# Patient Record
Sex: Male | Born: 1935 | Race: White | Hispanic: No | Smoking: Current every day smoker
Health system: Southern US, Community
[De-identification: ages and names within clinical notes are randomized; demographics above are authoritative.]

## PROBLEM LIST (undated history)

## (undated) DIAGNOSIS — R7302 Impaired glucose tolerance (oral): Secondary | ICD-10-CM

## (undated) DIAGNOSIS — Z720 Tobacco use: Secondary | ICD-10-CM

## (undated) DIAGNOSIS — Z86010 Personal history of colonic polyps: Secondary | ICD-10-CM

## (undated) DIAGNOSIS — C4490 Unspecified malignant neoplasm of skin, unspecified: Secondary | ICD-10-CM

## (undated) DIAGNOSIS — J449 Chronic obstructive pulmonary disease, unspecified: Principal | ICD-10-CM

## (undated) HISTORY — DX: Personal history of colonic polyps: Z86.010

## (undated) HISTORY — DX: Impaired glucose tolerance (oral): R73.02

## (undated) HISTORY — DX: Tobacco use: Z72.0

## (undated) HISTORY — DX: Chronic obstructive pulmonary disease, unspecified: J44.9

## (undated) HISTORY — DX: Unspecified malignant neoplasm of skin, unspecified: C44.90

---

## 2001-10-19 ENCOUNTER — Encounter: Payer: Self-pay | Admitting: Internal Medicine

## 2001-10-19 ENCOUNTER — Encounter: Admission: RE | Admit: 2001-10-19 | Discharge: 2001-10-19 | Payer: Self-pay | Admitting: Internal Medicine

## 2002-10-22 ENCOUNTER — Encounter: Payer: Self-pay | Admitting: Internal Medicine

## 2002-10-22 ENCOUNTER — Encounter: Admission: RE | Admit: 2002-10-22 | Discharge: 2002-10-22 | Payer: Self-pay | Admitting: Internal Medicine

## 2003-10-29 ENCOUNTER — Encounter: Admission: RE | Admit: 2003-10-29 | Discharge: 2003-10-29 | Payer: Self-pay | Admitting: Internal Medicine

## 2003-11-03 ENCOUNTER — Encounter: Admission: RE | Admit: 2003-11-03 | Discharge: 2003-11-03 | Payer: Self-pay | Admitting: Internal Medicine

## 2006-02-24 ENCOUNTER — Encounter: Admission: RE | Admit: 2006-02-24 | Discharge: 2006-02-24 | Payer: Self-pay | Admitting: Internal Medicine

## 2007-03-02 ENCOUNTER — Ambulatory Visit: Payer: Self-pay | Admitting: Internal Medicine

## 2007-03-02 LAB — CONVERTED CEMR LAB
ALT: 26 units/L (ref 0–40)
AST: 27 units/L (ref 0–37)
Albumin: 3.3 g/dL — ABNORMAL LOW (ref 3.5–5.2)
Alkaline Phosphatase: 96 units/L (ref 39–117)
BUN: 11 mg/dL (ref 6–23)
Basophils Absolute: 0.1 10*3/uL (ref 0.0–0.1)
Basophils Relative: 1.2 % — ABNORMAL HIGH (ref 0.0–1.0)
Bilirubin Urine: NEGATIVE
Bilirubin, Direct: 0.1 mg/dL (ref 0.0–0.3)
CO2: 29 meq/L (ref 19–32)
Calcium: 8.2 mg/dL — ABNORMAL LOW (ref 8.4–10.5)
Chloride: 102 meq/L (ref 96–112)
Cholesterol: 113 mg/dL (ref 0–200)
Creatinine, Ser: 0.8 mg/dL (ref 0.4–1.5)
Eosinophils Absolute: 0.1 10*3/uL (ref 0.0–0.6)
Eosinophils Relative: 1.6 % (ref 0.0–5.0)
GFR calc Af Amer: 124 mL/min
GFR calc non Af Amer: 102 mL/min
Glucose, Bld: 88 mg/dL (ref 70–99)
HCT: 42.4 % (ref 39.0–52.0)
HDL: 40.8 mg/dL (ref >39.0)
Hemoglobin, Urine: NEGATIVE
Hemoglobin: 14.3 g/dL (ref 13.0–17.0)
Ketones, ur: NEGATIVE mg/dL
LDL Cholesterol: 54 mg/dL (ref 0–99)
Leukocytes, UA: NEGATIVE
Lymphocytes Relative: 18.1 % (ref 12.0–46.0)
MCHC: 33.8 g/dL (ref 30.0–36.0)
MCV: 102.8 fL — ABNORMAL HIGH (ref 78.0–100.0)
Monocytes Absolute: 1.1 10*3/uL — ABNORMAL HIGH (ref 0.2–0.7)
Monocytes Relative: 13.1 % — ABNORMAL HIGH (ref 3.0–11.0)
Neutro Abs: 5.6 10*3/uL (ref 1.4–7.7)
Neutrophils Relative %: 66 % (ref 43.0–77.0)
Nitrite: NEGATIVE
PSA: 0.32 ng/mL (ref 0.10–4.00)
Platelets: 314 10*3/uL (ref 150–400)
Potassium: 4.8 meq/L (ref 3.5–5.1)
RBC: 4.13 M/uL — ABNORMAL LOW (ref 4.22–5.81)
RDW: 14.4 % (ref 11.5–14.6)
Sodium: 136 meq/L (ref 135–145)
Specific Gravity, Urine: <=1.005 (ref 1.000–1.03)
TSH: 1.49 microintl units/mL (ref 0.35–5.50)
Total Bilirubin: 0.6 mg/dL (ref 0.3–1.2)
Total CHOL/HDL Ratio: 2.8
Total Protein, Urine: NEGATIVE mg/dL
Total Protein: 5.6 g/dL — ABNORMAL LOW (ref 6.0–8.3)
Triglycerides: 90 mg/dL (ref 0–149)
Urine Glucose: NEGATIVE mg/dL
Urobilinogen, UA: 0.2 (ref 0.0–1.0)
VLDL: 18 mg/dL (ref 0–40)
WBC: 8.4 10*3/uL (ref 4.5–10.5)
pH: 6 (ref 5.0–8.0)

## 2008-07-21 ENCOUNTER — Encounter: Payer: Self-pay | Admitting: Internal Medicine

## 2008-07-21 LAB — HM COLONOSCOPY: HM Colonoscopy: NORMAL

## 2008-07-23 ENCOUNTER — Encounter: Payer: Self-pay | Admitting: Internal Medicine

## 2008-07-23 DIAGNOSIS — Z860100 Personal history of colon polyps, unspecified: Secondary | ICD-10-CM | POA: Insufficient documentation

## 2008-07-23 HISTORY — DX: Personal history of colonic polyps: Z86.010

## 2011-10-09 ENCOUNTER — Encounter: Payer: Self-pay | Admitting: Internal Medicine

## 2011-10-09 DIAGNOSIS — Z0000 Encounter for general adult medical examination without abnormal findings: Secondary | ICD-10-CM | POA: Insufficient documentation

## 2011-10-12 ENCOUNTER — Other Ambulatory Visit (INDEPENDENT_AMBULATORY_CARE_PROVIDER_SITE_OTHER): Payer: Medicare Other

## 2011-10-12 ENCOUNTER — Ambulatory Visit (INDEPENDENT_AMBULATORY_CARE_PROVIDER_SITE_OTHER)
Admission: RE | Admit: 2011-10-12 | Discharge: 2011-10-12 | Disposition: A | Discharge status: Home / Self Care | Payer: Medicare Other | Source: Ambulatory Visit | Attending: Internal Medicine | Admitting: Internal Medicine

## 2011-10-12 ENCOUNTER — Ambulatory Visit (INDEPENDENT_AMBULATORY_CARE_PROVIDER_SITE_OTHER): Payer: Medicare Other | Admitting: Internal Medicine

## 2011-10-12 ENCOUNTER — Encounter: Payer: Self-pay | Admitting: Internal Medicine

## 2011-10-12 VITALS — BP 140/78 | HR 78 | Temp 97.7°F | Ht 70.5 in | Wt 123.4 lb

## 2011-10-12 DIAGNOSIS — R7302 Impaired glucose tolerance (oral): Secondary | ICD-10-CM

## 2011-10-12 DIAGNOSIS — Z136 Encounter for screening for cardiovascular disorders: Secondary | ICD-10-CM

## 2011-10-12 DIAGNOSIS — J4489 Other specified chronic obstructive pulmonary disease: Secondary | ICD-10-CM

## 2011-10-12 DIAGNOSIS — Z23 Encounter for immunization: Secondary | ICD-10-CM

## 2011-10-12 DIAGNOSIS — J449 Chronic obstructive pulmonary disease, unspecified: Secondary | ICD-10-CM

## 2011-10-12 DIAGNOSIS — R7309 Other abnormal glucose: Secondary | ICD-10-CM

## 2011-10-12 DIAGNOSIS — R5383 Other fatigue: Secondary | ICD-10-CM

## 2011-10-12 DIAGNOSIS — N320 Bladder-neck obstruction: Secondary | ICD-10-CM

## 2011-10-12 DIAGNOSIS — Z8611 Personal history of tuberculosis: Secondary | ICD-10-CM

## 2011-10-12 DIAGNOSIS — R5381 Other malaise: Secondary | ICD-10-CM

## 2011-10-12 DIAGNOSIS — C4490 Unspecified malignant neoplasm of skin, unspecified: Secondary | ICD-10-CM

## 2011-10-12 DIAGNOSIS — F17210 Nicotine dependence, cigarettes, uncomplicated: Secondary | ICD-10-CM | POA: Insufficient documentation

## 2011-10-12 DIAGNOSIS — Z720 Tobacco use: Secondary | ICD-10-CM

## 2011-10-12 HISTORY — DX: Impaired glucose tolerance (oral): R73.02

## 2011-10-12 HISTORY — DX: Chronic obstructive pulmonary disease, unspecified: J44.9

## 2011-10-12 HISTORY — DX: Tobacco use: Z72.0

## 2011-10-12 HISTORY — DX: Unspecified malignant neoplasm of skin, unspecified: C44.90

## 2011-10-12 LAB — CBC WITH DIFFERENTIAL/PLATELET
Basophils Absolute: 0 10*3/uL (ref 0.0–0.1)
Eosinophils Relative: 1.9 % (ref 0.0–5.0)
HCT: 44.5 % (ref 39.0–52.0)
Hemoglobin: 14.8 g/dL (ref 13.0–17.0)
Lymphs Abs: 1.4 10*3/uL (ref 0.7–4.0)
MCV: 107.2 fl — ABNORMAL HIGH (ref 78.0–100.0)
Monocytes Absolute: 1 10*3/uL (ref 0.1–1.0)
Neutro Abs: 4.3 10*3/uL (ref 1.4–7.7)
Platelets: 278 10*3/uL (ref 150.0–400.0)
RDW: 15.7 % — ABNORMAL HIGH (ref 11.5–14.6)

## 2011-10-12 LAB — BASIC METABOLIC PANEL
GFR: 96.56 mL/min (ref >60.00)
Potassium: 4.9 mEq/L (ref 3.5–5.1)
Sodium: 135 mEq/L (ref 135–145)

## 2011-10-12 LAB — LIPID PANEL
LDL Cholesterol: 57 mg/dL (ref 0–99)
Total CHOL/HDL Ratio: 2
VLDL: 14.6 mg/dL (ref 0.0–40.0)

## 2011-10-12 LAB — URINALYSIS, ROUTINE W REFLEX MICROSCOPIC
Bilirubin Urine: NEGATIVE
Ketones, ur: NEGATIVE
Leukocytes, UA: NEGATIVE
Urine Glucose: NEGATIVE
pH: 6 (ref 5.0–8.0)

## 2011-10-12 LAB — HEPATIC FUNCTION PANEL
AST: 25 U/L (ref 0–37)
Alkaline Phosphatase: 71 U/L (ref 39–117)
Total Bilirubin: 0.6 mg/dL (ref 0.3–1.2)

## 2011-10-12 LAB — TSH: TSH: 2.12 u[IU]/mL (ref 0.35–5.50)

## 2011-10-12 LAB — PSA: PSA: 0.76 ng/mL (ref 0.10–4.00)

## 2011-10-12 NOTE — Assessment & Plan Note (Signed)
S/p left pinna and left cheek approx 2007;  For referral for new lesion right face under lower eyelid

## 2011-10-12 NOTE — Assessment & Plan Note (Deleted)

## 2011-10-12 NOTE — Assessment & Plan Note (Signed)
stable overall by hx and exam, most recent data reviewed with pt, and pt to continue medical treatment as before  SpO2 Readings from Last 3 Encounters:  10/12/11 97%

## 2011-10-12 NOTE — Patient Instructions (Addendum)
You had the flu, pneumonia, and tetanus shots today The nurse will check on the shingles shot Please stop smoking Please go to LAB in the Basement for the blood and/or urine tests to be done today Please go to XRAY in the Basement for the x-ray test Please call the phone number [phone] (the [employer] System) for results of testing in 2-3 days;  When calling, simply dial the number, and when prompted enter the MRN number above (the Medical Record Number) and the # key, then the message should start You will be contacted regarding the referral for: dermatology Please return in 1 year for your yearly visit, or sooner if needed

## 2011-10-12 NOTE — Assessment & Plan Note (Signed)
Asymtp, but will need a1c, cont diet,  to f/u any worsening symptoms or concerns

## 2011-10-12 NOTE — Assessment & Plan Note (Signed)
Etiology unclear, Exam otherwise benign, to check labs as documented, follow with expectant management, ECG reviewed as per emr

## 2011-10-13 ENCOUNTER — Encounter: Payer: Self-pay | Admitting: Internal Medicine

## 2011-10-13 ENCOUNTER — Telehealth: Payer: Self-pay | Admitting: *Deleted

## 2011-10-13 MED ORDER — ASPIRIN EC 81 MG PO TBEC: 81.0000 mg | DELAYED_RELEASE_TABLET | Freq: Every day | ORAL | Status: AC

## 2011-10-13 NOTE — Telephone Encounter (Signed)
Called pt spoke with wife was not at home went to dermatologist appt left msg with her to give [place] a call back...10/13/11@1 :30pm/LMB

## 2011-10-13 NOTE — Assessment & Plan Note (Signed)
Urged to quit 

## 2011-10-13 NOTE — Telephone Encounter (Signed)
Pt spouse requested lab result mailed to pt' home. Labs sent.

## 2011-10-13 NOTE — Assessment & Plan Note (Signed)
Asymtp, to check f/u cxr per pt request, has had yearly cxr in the past

## 2011-10-13 NOTE — Assessment & Plan Note (Signed)
asympt - also for PSA, UA today

## 2011-10-13 NOTE — Telephone Encounter (Signed)
Message copied by Deatra James on Thu Oct 13, 2011  1:29 PM ------      Message from: Anselm Jungling      Created: Thu Oct 13, 2011  7:59 AM      Regarding: FW: call pt                   ----- Message -----         From: Oliver Barre, MD         Sent: 10/13/2011   7:11 AM           To: Margaret Pyle, CMA      Subject: call pt                                                  Not mentioned yest at his exam, but pt needs to start ASA 81 mg - 1 per day, to reduce risk of stroke and MI

## 2011-10-13 NOTE — Progress Notes (Signed)
  Subjective:    Patient ID: Vincent [name], male    DOB: Jun 10, 1936, 75 y.o.   MRN: [id number]  HPI  Here to establish, wife is pt here as well. Pt denies chest pain, increased sob or doe, wheezing, orthopnea, PND, increased LE swelling, palpitations, dizziness or syncope.  Pt denies new neurological symptoms such as new headache, or facial or extremity weakness or numbness, though Does have sense of ongoing fatigue, but denies signficant hypersomnolence.   Pt denies polydipsia, polyuria,   Pt states overall good compliance with meds, trying to follow lower cholesterol  diet, wt overall stable but little exercise however.    Hard to quit smoking but is considering.  Requests cxr with hx of TB remotely.   Pt denies fever, wt loss, night sweats, loss of appetite, or other constitutional symptoms.  Does have recent worsening skin lesion to right cheek below the right eye in last 2 mo, with hx of skin ca a few yrs ago removed from left pinna per derm. Past Medical History  Diagnosis Date  . COLONIC POLYPS, HX OF 07/23/2008  . COPD (chronic obstructive pulmonary disease) 10/12/2011  . Tobacco abuse 10/12/2011  . Impaired glucose tolerance 10/12/2011  . Skin cancer 10/12/2011   History reviewed. No pertinent past surgical history.  reports that he has been smoking.  He does not have any smokeless tobacco history on file. He reports that he drinks alcohol. He reports that he does not use illicit drugs. family history includes Cancer in his mother and Heart disease in his father. No Known Allergies No current outpatient prescriptions on file prior to visit.   Review of Systems Review of Systems  Constitutional: Negative for diaphoresis and unexpected weight change.  HENT: Negative for drooling and tinnitus.   Eyes: Negative for photophobia and visual disturbance.  Respiratory: Negative for choking and stridor.   Gastrointestinal: Negative for vomiting and blood in stool.  Genitourinary: Negative  for hematuria and decreased urine volume.  Musculoskeletal: Negative for gait problem.  Skin: Negative for color change and wound.  Neurological: Negative for tremors and numbness.  Psychiatric/Behavioral: Negative for decreased concentration. The patient is not hyperactive.       Objective:   Physical Exam BP 140/78  Pulse 78  Temp(Src) 97.7 F (36.5 C) (Oral)  Ht 5' 10.5" (1.791 m)  Wt 123 lb 6.4 oz (55.974 kg)  BMI 17.46 kg/m2  SpO2 97% Physical Exam  VS noted Constitutional: Pt appears well-developed and well-nourished.  HENT: Head: Normocephalic.  Right Ear: External ear normal.  Left Ear: External ear normal.  Eyes: Conjunctivae and EOM are normal. Pupils are equal, round, and reactive to light.  Neck: Normal range of motion. Neck supple.  Cardiovascular: Normal rate and regular rhythm.   Pulmonary/Chest: Effort normal and breath sounds mild decreased, no rales or wheezing  Abd:  Soft, NT, non-distended, + BS Neurological: Pt is alert. No cranial nerve deficit.  Skin: Skin is warm. No erythema. right face with 10 mm horn like lesion below the right eyelid Psychiatric: Pt behavior is normal. Thought content normal.     Assessment & Plan:

## 2011-10-20 ENCOUNTER — Telehealth: Payer: Self-pay

## 2011-10-20 NOTE — Telephone Encounter (Signed)
Pt called requesting Rx for Zostavax to pharmacy

## 2011-10-20 NOTE — Telephone Encounter (Signed)
Done hardcopy to robin  

## 2011-10-20 NOTE — Telephone Encounter (Signed)
Called the patient informed rx was completed. Informed the patient of the cost of the shingles vaccine per MD's instructions. The patient stated was ok and to fax to [facility] as [facility] [address]. Informed they do not administer the shingles vaccine.

## 2011-10-21 ENCOUNTER — Telehealth: Payer: Self-pay

## 2011-10-21 NOTE — Telephone Encounter (Signed)
Called the patient to inform faxed prescription to [facility] for his shingles vaccine last yesterday 10/20/2011. But shortly after received a call from his wife Malachi Bonds [place]) over cost of the vaccine as I had informed the patient of the cost per MD's instructions. Informed the patient that [facility] had been instructed not to fill prescription until he had made a decision he wanted to receive it.

## 2011-10-21 NOTE — Telephone Encounter (Signed)
The patients wife called to inform that the patient did not get the shingles vaccine that was sent to [facility] on 10/20/2011, the cost was too much and has decided not to get it at this time.

## 2012-11-29 ENCOUNTER — Ambulatory Visit (INDEPENDENT_AMBULATORY_CARE_PROVIDER_SITE_OTHER): Payer: 59 | Admitting: Radiology

## 2012-11-29 DIAGNOSIS — Z23 Encounter for immunization: Secondary | ICD-10-CM

## 2013-08-08 ENCOUNTER — Other Ambulatory Visit: Payer: Self-pay | Admitting: Gastroenterology

## 2013-08-08 DIAGNOSIS — Z139 Encounter for screening, unspecified: Secondary | ICD-10-CM

## 2013-09-16 ENCOUNTER — Ambulatory Visit
Admission: RE | Admit: 2013-09-16 | Discharge: 2013-09-16 | Disposition: A | Discharge status: Home / Self Care | Payer: Medicare Other | Source: Ambulatory Visit | Attending: Gastroenterology | Admitting: Gastroenterology

## 2013-09-16 DIAGNOSIS — Z139 Encounter for screening, unspecified: Secondary | ICD-10-CM

## 2013-09-17 ENCOUNTER — Telehealth: Payer: Self-pay | Admitting: Internal Medicine

## 2013-09-17 NOTE — Telephone Encounter (Signed)
Recd records from [facility], Forwarding 3 pages to Dr.John

## 2013-09-23 ENCOUNTER — Ambulatory Visit (INDEPENDENT_AMBULATORY_CARE_PROVIDER_SITE_OTHER): Payer: 59 | Admitting: Radiology

## 2013-09-23 DIAGNOSIS — Z23 Encounter for immunization: Secondary | ICD-10-CM

## 2014-04-30 ENCOUNTER — Other Ambulatory Visit (INDEPENDENT_AMBULATORY_CARE_PROVIDER_SITE_OTHER): Payer: Medicare Other

## 2014-04-30 ENCOUNTER — Ambulatory Visit (INDEPENDENT_AMBULATORY_CARE_PROVIDER_SITE_OTHER): Payer: Medicare Other | Admitting: Internal Medicine

## 2014-04-30 ENCOUNTER — Encounter: Payer: Self-pay | Admitting: Internal Medicine

## 2014-04-30 ENCOUNTER — Ambulatory Visit (INDEPENDENT_AMBULATORY_CARE_PROVIDER_SITE_OTHER)
Admission: RE | Admit: 2014-04-30 | Discharge: 2014-04-30 | Disposition: A | Discharge status: Home / Self Care | Payer: Medicare Other | Source: Ambulatory Visit | Attending: Internal Medicine | Admitting: Internal Medicine

## 2014-04-30 ENCOUNTER — Telehealth: Payer: Self-pay | Admitting: Internal Medicine

## 2014-04-30 VITALS — BP 130/80 | HR 83 | Temp 98.3°F | Ht 69.0 in | Wt 123.0 lb

## 2014-04-30 DIAGNOSIS — Z0000 Encounter for general adult medical examination without abnormal findings: Secondary | ICD-10-CM

## 2014-04-30 DIAGNOSIS — Z136 Encounter for screening for cardiovascular disorders: Secondary | ICD-10-CM

## 2014-04-30 DIAGNOSIS — Z8611 Personal history of tuberculosis: Secondary | ICD-10-CM

## 2014-04-30 DIAGNOSIS — J449 Chronic obstructive pulmonary disease, unspecified: Secondary | ICD-10-CM

## 2014-04-30 DIAGNOSIS — Z23 Encounter for immunization: Secondary | ICD-10-CM

## 2014-04-30 DIAGNOSIS — Z125 Encounter for screening for malignant neoplasm of prostate: Secondary | ICD-10-CM

## 2014-04-30 LAB — URINALYSIS, ROUTINE W REFLEX MICROSCOPIC
Bilirubin Urine: NEGATIVE
Hgb urine dipstick: NEGATIVE
KETONES UR: NEGATIVE
LEUKOCYTES UA: NEGATIVE
NITRITE: NEGATIVE
Specific Gravity, Urine: 1.015 (ref 1.000–1.030)
Total Protein, Urine: NEGATIVE
Urine Glucose: NEGATIVE
Urobilinogen, UA: 0.2 (ref 0.0–1.0)
pH: 6 (ref 5.0–8.0)

## 2014-04-30 LAB — BASIC METABOLIC PANEL
BUN: 13 mg/dL (ref 6–23)
CHLORIDE: 102 meq/L (ref 96–112)
CO2: 29 meq/L (ref 19–32)
Calcium: 8.6 mg/dL (ref 8.4–10.5)
Creatinine, Ser: 0.9 mg/dL (ref 0.4–1.5)
GFR: 88.47 mL/min (ref >60.00)
Glucose, Bld: 90 mg/dL (ref 70–99)
POTASSIUM: 5.5 meq/L — AB (ref 3.5–5.1)
SODIUM: 135 meq/L (ref 135–145)

## 2014-04-30 LAB — HEPATIC FUNCTION PANEL
ALT: 24 U/L (ref 0–53)
AST: 22 U/L (ref 0–37)
Albumin: 3.9 g/dL (ref 3.5–5.2)
Alkaline Phosphatase: 93 U/L (ref 39–117)
BILIRUBIN DIRECT: 0.1 mg/dL (ref 0.0–0.3)
TOTAL PROTEIN: 6 g/dL (ref 6.0–8.3)
Total Bilirubin: 0.3 mg/dL (ref 0.2–1.2)

## 2014-04-30 LAB — LIPID PANEL
Cholesterol: 116 mg/dL (ref 0–200)
HDL: 47.8 mg/dL (ref >39.00)
LDL Cholesterol: 58 mg/dL (ref 0–99)
TRIGLYCERIDES: 53 mg/dL (ref 0.0–149.0)
Total CHOL/HDL Ratio: 2
VLDL: 10.6 mg/dL (ref 0.0–40.0)

## 2014-04-30 LAB — CBC WITH DIFFERENTIAL/PLATELET
Basophils Absolute: 0 10*3/uL (ref 0.0–0.1)
Basophils Relative: 0.5 % (ref 0.0–3.0)
Eosinophils Absolute: 0.1 10*3/uL (ref 0.0–0.7)
Eosinophils Relative: 1.5 % (ref 0.0–5.0)
HCT: 40.5 % (ref 39.0–52.0)
HEMOGLOBIN: 13.6 g/dL (ref 13.0–17.0)
Lymphocytes Relative: 19.2 % (ref 12.0–46.0)
Lymphs Abs: 1.4 10*3/uL (ref 0.7–4.0)
MCHC: 33.5 g/dL (ref 30.0–36.0)
MCV: 107.7 fl — ABNORMAL HIGH (ref 78.0–100.0)
MONOS PCT: 14.2 % — AB (ref 3.0–12.0)
Monocytes Absolute: 1 10*3/uL (ref 0.1–1.0)
Neutro Abs: 4.5 10*3/uL (ref 1.4–7.7)
Neutrophils Relative %: 64.6 % (ref 43.0–77.0)
PLATELETS: 307 10*3/uL (ref 150.0–400.0)
RBC: 3.76 Mil/uL — ABNORMAL LOW (ref 4.22–5.81)
RDW: 15.9 % — ABNORMAL HIGH (ref 11.5–15.5)
WBC: 7 10*3/uL (ref 4.0–10.5)

## 2014-04-30 LAB — TSH: TSH: 1.41 u[IU]/mL (ref 0.35–4.50)

## 2014-04-30 LAB — PSA: PSA: 0.72 ng/mL (ref 0.10–4.00)

## 2014-04-30 MED ORDER — ASPIRIN EC 81 MG PO TBEC: 81.0000 mg | DELAYED_RELEASE_TABLET | Freq: Every day | ORAL | Status: DC

## 2014-04-30 MED ORDER — TIOTROPIUM BROMIDE MONOHYDRATE 18 MCG IN CAPS: 18.0000 ug | ORAL_CAPSULE | Freq: Every day | RESPIRATORY_TRACT | Status: DC

## 2014-04-30 NOTE — Telephone Encounter (Signed)
Relevant patient education mailed to patient.  

## 2014-04-30 NOTE — Patient Instructions (Addendum)
You had the new Prevnar pneumonia shot today  Please start Aspirin 81 mg (enteric coated only) - 1 per day - to help reduce risk of heart attack and stroke  Please take all new medication as prescribed - the Spiriva  Please go to the XRAY Department in the Basement (go straight as you get off the elevator) for the x-ray testing  Please go to the LAB in the Basement (turn left off the elevator) for the tests to be done today  You will be contacted by phone if any changes need to be made immediately.  Otherwise, you will receive a letter about your results with an explanation, but please check with MyChart first.  Please remember to sign up for MyChart if you have not done so, as this will be important to you in the future with finding out test results, communicating by private email, and scheduling acute appointments online when needed.  Please keep your appointments with your specialists as you may have planned  Please return in 1 year for your yearly visit, or sooner if needed

## 2014-04-30 NOTE — Progress Notes (Signed)
Subjective:    Patient ID: Vincent [name], male    DOB: 09/30/1936, 78 y.o.   MRN: [id number]  HPI  Here for wellness and f/u;  Overall doing ok;  Pt denies CP, worsening SOB, DOE, wheezing, orthopnea, PND, worsening LE edema, palpitations, dizziness or syncope, except for increased mild sob/doe over last year.  Pt denies neurological change such as new headache, facial or extremity weakness.  Pt denies polydipsia, polyuria, or low sugar symptoms. Pt states overall good compliance with treatment and medications, good tolerability, and has been trying to follow lower cholesterol diet.  Pt denies worsening depressive symptoms, suicidal ideation or panic. No fever, night sweats, wt loss, loss of appetite, or other constitutional symptoms.  Pt states good ability with ADL's, has low fall risk, home safety reviewed and adequate, no other significant changes in hearing or vision, and only occasionally active with exercise.  Hx of TB - requests f/u cxr Past Medical History  Diagnosis Date  . COLONIC POLYPS, HX OF 07/23/2008  . COPD (chronic obstructive pulmonary disease) 10/12/2011  . Tobacco abuse 10/12/2011  . Impaired glucose tolerance 10/12/2011  . Skin cancer 10/12/2011   No past surgical history on file.  reports that he has been smoking.  He does not have any smokeless tobacco history on file. He reports that he drinks alcohol. He reports that he does not use illicit drugs. family history includes Cancer in his mother; Heart disease in his father. No Known Allergies No current outpatient prescriptions on file prior to visit.   No current facility-administered medications on file prior to visit.   Review of Systems Constitutional: Negative for increased diaphoresis, other activity, appetite or other siginficant weight change  HENT: Negative for worsening hearing loss, ear pain, facial swelling, mouth sores and neck stiffness.   Eyes: Negative for other worsening pain, redness or visual  disturbance.  Respiratory: Negative for shortness of breath and wheezing.   Cardiovascular: Negative for chest pain and palpitations.  Gastrointestinal: Negative for diarrhea, blood in stool, abdominal distention or other pain Genitourinary: Negative for hematuria, flank pain or change in urine volume.  Musculoskeletal: Negative for myalgias or other joint complaints.  Skin: Negative for color change and wound.  Neurological: Negative for syncope and numbness. other than noted Hematological: Negative for adenopathy. or other swelling Psychiatric/Behavioral: Negative for hallucinations, self-injury, decreased concentration or other worsening agitation.      Objective:   Physical Exam BP 130/80  Pulse 83  Temp(Src) 98.3 F (36.8 C) (Oral)  Ht 5\' 9"  (1.753 m)  Wt 123 lb (55.792 kg)  BMI 18.16 kg/m2  SpO2 95% VS noted,  Constitutional: Pt is oriented to person, place, and time. Appears well-developed and well-nourished.  Head: Normocephalic and atraumatic.  Right Ear: External ear normal.  Left Ear: External ear normal.  Nose: Nose normal.  Mouth/Throat: Oropharynx is clear and moist.  Eyes: Conjunctivae and EOM are normal. Pupils are equal, [name], and reactive to light.  Neck: Normal range of motion. Neck supple. No JVD present. No tracheal deviation present.  Cardiovascular: Normal rate, regular rhythm, normal heart sounds and intact distal pulses.   Pulmonary/Chest: Effort normal and breath sounds without rales or wheezing  Abdominal: Soft. Bowel sounds are normal. NT. No HSM  Musculoskeletal: Normal range of motion. Exhibits no edema.  Lymphadenopathy:  Has no cervical adenopathy.  Neurological: Pt is alert and oriented to person, place, and time. Pt has normal reflexes. No cranial nerve deficit. Motor grossly intact Skin: Skin  is warm and dry. No rash noted.  Psychiatric:  Has normal mood and affect. Behavior is normal.     Assessment & Plan:

## 2014-04-30 NOTE — Assessment & Plan Note (Signed)

## 2014-04-30 NOTE — Progress Notes (Signed)
Pre visit review using our clinic review tool, if applicable. No additional management support is needed unless otherwise documented below in the visit note. 

## 2014-05-03 NOTE — Assessment & Plan Note (Signed)
For cxr

## 2014-05-03 NOTE — Assessment & Plan Note (Signed)
For spiriva asd, o/w stable overall by history and exam, recent data reviewed with pt, and pt to continue medical treatment as before,  to f/u any worsening symptoms or concerns SpO2 Readings from Last 3 Encounters:  04/30/14 95%  10/12/11 97%

## 2014-11-26 ENCOUNTER — Ambulatory Visit: Payer: 59 | Admitting: Radiology

## 2014-11-26 ENCOUNTER — Ambulatory Visit (INDEPENDENT_AMBULATORY_CARE_PROVIDER_SITE_OTHER): Payer: 59 | Admitting: Radiology

## 2014-11-26 DIAGNOSIS — Z23 Encounter for immunization: Secondary | ICD-10-CM

## 2015-05-07 ENCOUNTER — Other Ambulatory Visit (INDEPENDENT_AMBULATORY_CARE_PROVIDER_SITE_OTHER): Payer: Medicare Other

## 2015-05-07 ENCOUNTER — Ambulatory Visit (INDEPENDENT_AMBULATORY_CARE_PROVIDER_SITE_OTHER): Payer: Medicare Other | Admitting: Internal Medicine

## 2015-05-07 ENCOUNTER — Encounter: Payer: Self-pay | Admitting: Internal Medicine

## 2015-05-07 VITALS — BP 150/64 | HR 82 | Temp 98.6°F | Wt 121.2 lb

## 2015-05-07 DIAGNOSIS — Z23 Encounter for immunization: Secondary | ICD-10-CM | POA: Diagnosis not present

## 2015-05-07 DIAGNOSIS — Z720 Tobacco use: Secondary | ICD-10-CM

## 2015-05-07 DIAGNOSIS — Z0000 Encounter for general adult medical examination without abnormal findings: Secondary | ICD-10-CM

## 2015-05-07 DIAGNOSIS — J449 Chronic obstructive pulmonary disease, unspecified: Secondary | ICD-10-CM

## 2015-05-07 DIAGNOSIS — N320 Bladder-neck obstruction: Secondary | ICD-10-CM

## 2015-05-07 DIAGNOSIS — R7302 Impaired glucose tolerance (oral): Secondary | ICD-10-CM

## 2015-05-07 LAB — LIPID PANEL
CHOLESTEROL: 110 mg/dL (ref 0–200)
HDL: 41.5 mg/dL (ref >39.00)
LDL Cholesterol: 56 mg/dL (ref 0–99)
NonHDL: 68.5
TRIGLYCERIDES: 61 mg/dL (ref 0.0–149.0)
Total CHOL/HDL Ratio: 3
VLDL: 12.2 mg/dL (ref 0.0–40.0)

## 2015-05-07 LAB — CBC WITH DIFFERENTIAL/PLATELET
BASOS PCT: 0.8 % (ref 0.0–3.0)
Basophils Absolute: 0 10*3/uL (ref 0.0–0.1)
EOS ABS: 0.1 10*3/uL (ref 0.0–0.7)
EOS PCT: 1.1 % (ref 0.0–5.0)
HEMATOCRIT: 41.6 % (ref 39.0–52.0)
HEMOGLOBIN: 14.1 g/dL (ref 13.0–17.0)
LYMPHS ABS: 1.3 10*3/uL (ref 0.7–4.0)
Lymphocytes Relative: 22.8 % (ref 12.0–46.0)
MCHC: 34 g/dL (ref 30.0–36.0)
MCV: 105.9 fl — AB (ref 78.0–100.0)
MONO ABS: 0.8 10*3/uL (ref 0.1–1.0)
Monocytes Relative: 13.2 % — ABNORMAL HIGH (ref 3.0–12.0)
NEUTROS ABS: 3.7 10*3/uL (ref 1.4–7.7)
Neutrophils Relative %: 62.1 % (ref 43.0–77.0)
Platelets: 300 10*3/uL (ref 150.0–400.0)
RBC: 3.93 Mil/uL — ABNORMAL LOW (ref 4.22–5.81)
RDW: 16.8 % — ABNORMAL HIGH (ref 11.5–15.5)
WBC: 5.9 10*3/uL (ref 4.0–10.5)

## 2015-05-07 LAB — URINALYSIS, ROUTINE W REFLEX MICROSCOPIC
Bilirubin Urine: NEGATIVE
HGB URINE DIPSTICK: NEGATIVE
Ketones, ur: NEGATIVE
LEUKOCYTES UA: NEGATIVE
Nitrite: NEGATIVE
Specific Gravity, Urine: 1.02 (ref 1.000–1.030)
Total Protein, Urine: NEGATIVE
URINE GLUCOSE: NEGATIVE
UROBILINOGEN UA: 0.2 (ref 0.0–1.0)
WBC UA: NONE SEEN (ref 0–?)
pH: 6 (ref 5.0–8.0)

## 2015-05-07 LAB — BASIC METABOLIC PANEL
BUN: 17 mg/dL (ref 6–23)
CO2: 28 mEq/L (ref 19–32)
Calcium: 8.3 mg/dL — ABNORMAL LOW (ref 8.4–10.5)
Chloride: 106 mEq/L (ref 96–112)
Creatinine, Ser: 0.91 mg/dL (ref 0.40–1.50)
GFR: 85.99 mL/min (ref >60.00)
Glucose, Bld: 80 mg/dL (ref 70–99)
POTASSIUM: 5 meq/L (ref 3.5–5.1)
Sodium: 136 mEq/L (ref 135–145)

## 2015-05-07 LAB — HEPATIC FUNCTION PANEL
ALBUMIN: 3.7 g/dL (ref 3.5–5.2)
ALT: 18 U/L (ref 0–53)
AST: 20 U/L (ref 0–37)
Alkaline Phosphatase: 110 U/L (ref 39–117)
BILIRUBIN TOTAL: 0.4 mg/dL (ref 0.2–1.2)
Bilirubin, Direct: 0.1 mg/dL (ref 0.0–0.3)
Total Protein: 5.8 g/dL — ABNORMAL LOW (ref 6.0–8.3)

## 2015-05-07 LAB — HEMOGLOBIN A1C: HEMOGLOBIN A1C: 5.2 % (ref 4.6–6.5)

## 2015-05-07 LAB — PSA: PSA: 0.76 ng/mL (ref 0.10–4.00)

## 2015-05-07 LAB — TSH: TSH: 1.08 u[IU]/mL (ref 0.35–4.50)

## 2015-05-07 NOTE — Patient Instructions (Addendum)
You had the Shingles shot today  Please continue all other medications as before, and refills have been done if requested.  Please have the pharmacy call with any other refills you may need.  Please continue your efforts at being more active, low cholesterol diet, and weight control.  You are otherwise up to date with prevention measures today.  Please keep your appointments with your specialists as you may have planned  Please stop smoking  Please go to the LAB in the Basement (turn left off the elevator) for the tests to be done today  You will be contacted by phone if any changes need to be made immediately.  Otherwise, you will receive a letter about your results with an explanation, but please check with MyChart first.  Please remember to sign up for MyChart if you have not done so, as this will be important to you in the future with finding out test results, communicating by private email, and scheduling acute appointments online when needed.  Please return in 1 year for your yearly visit, or sooner if needed, with Lab testing done 3-5 days before

## 2015-05-07 NOTE — Assessment & Plan Note (Signed)
stable overall by history and exam, recent data reviewed with pt, and pt to continue medical treatment as before,  to f/u any worsening symptoms or concerns Lab Results  Component Value Date   HGBA1C 5.3 10/12/2011   For f/u a1c

## 2015-05-07 NOTE — Progress Notes (Signed)
Pre visit review using our clinic review tool, if applicable. No additional management support is needed unless otherwise documented below in the visit note. 

## 2015-05-07 NOTE — Assessment & Plan Note (Signed)

## 2015-05-07 NOTE — Progress Notes (Signed)
Subjective:    Patient ID: Vincent [name], male    DOB: 25-Oct-1936, 79 y.o.   MRN: [id number]  HPI  Here for wellness and f/u;  Overall doing ok;  Pt denies Chest pain, worsening SOB, DOE, wheezing, orthopnea, PND, worsening LE edema, palpitations, dizziness or syncope.  Pt denies neurological change such as new headache, facial or extremity weakness.  Pt denies polydipsia, polyuria, or low sugar symptoms. Pt states overall good compliance with treatment and medications, good tolerability, and has been trying to follow appropriate diet.  Pt denies worsening depressive symptoms, suicidal ideation or panic. No fever, night sweats, wt loss, loss of appetite, or other constitutional symptoms.  Pt states good ability with ADL's, has low fall risk, home safety reviewed and adequate, no other significant changes in hearing or vision, and only occasionally active with exercise. Still smoking, not ready to quit. No current compalitns Wt Readings from Last 3 Encounters:  05/07/15 121 lb 3.2 oz (54.976 kg)  04/30/14 123 lb (55.792 kg)  10/12/11 123 lb 6.4 oz (55.974 kg)   Past Medical History  Diagnosis Date  . COLONIC POLYPS, HX OF 07/23/2008  . COPD (chronic obstructive pulmonary disease) 10/12/2011  . Tobacco abuse 10/12/2011  . Impaired glucose tolerance 10/12/2011  . Skin cancer 10/12/2011   No past surgical history on file.  reports that he has been smoking.  He does not have any smokeless tobacco history on file. He reports that he drinks alcohol. He reports that he does not use illicit drugs. family history includes Cancer in his mother; Heart disease in his father. No Known Allergies Current Outpatient Prescriptions on File Prior to Visit  Medication Sig Dispense Refill  . aspirin EC 81 MG tablet Take 1 tablet (81 mg total) by mouth daily. 90 tablet 11  . tiotropium (SPIRIVA HANDIHALER) 18 MCG inhalation capsule Place 1 capsule (18 mcg total) into inhaler and inhale daily. 90 capsule 3    No current facility-administered medications on file prior to visit.   Review of Systems Constitutional: Negative for increased diaphoresis, other activity, appetite or siginficant weight change other than noted HENT: Negative for worsening hearing loss, ear pain, facial swelling, mouth sores and neck stiffness.   Eyes: Negative for other worsening pain, redness or visual disturbance.  Respiratory: Negative for shortness of breath and wheezing  Cardiovascular: Negative for chest pain and palpitations.  Gastrointestinal: Negative for diarrhea, blood in stool, abdominal distention or other pain Genitourinary: Negative for hematuria, flank pain or change in urine volume.  Musculoskeletal: Negative for myalgias or other joint complaints.  Skin: Negative for color change and wound or drainage.  Neurological: Negative for syncope and numbness. other than noted Hematological: Negative for adenopathy. or other swelling Psychiatric/Behavioral: Negative for hallucinations, SI, self-injury, decreased concentration or other worsening agitation.      Objective:   Physical Exam BP 150/64 mmHg  Pulse 82  Temp(Src) 98.6 F (37 C) (Oral)  Wt 121 lb 3.2 oz (54.976 kg)  SpO2 97% VS noted,  Constitutional: Pt is oriented to person, place, and time. Appears well-developed and well-nourished, in no significant distress but thin Head: Normocephalic and atraumatic.  Right Ear: External ear normal.  Left Ear: External ear normal.  Nose: Nose normal.  Mouth/Throat: Oropharynx is clear and moist.  Eyes: Conjunctivae and EOM are normal. Pupils are equal, [name], and reactive to light.  Neck: Normal range of motion. Neck supple. No JVD present. No tracheal deviation present or significant neck LA or mass  Cardiovascular: Normal rate, regular rhythm, normal heart sounds and intact distal pulses.   Pulmonary/Chest: Effort normal and breath sounds without rales or wheezing  Abdominal: Soft. Bowel sounds are  normal. NT. No HSM  Musculoskeletal: Normal range of motion. Exhibits no edema.  Lymphadenopathy:  Has no cervical adenopathy.  Neurological: Pt is alert and oriented to person, place, and time. Pt has normal reflexes. No cranial nerve deficit. Motor grossly intact Skin: Skin is warm and dry. No rash noted.  Psychiatric:  Has normal mood and affect. Behavior is normal.     Assessment & Plan:

## 2015-05-07 NOTE — Assessment & Plan Note (Signed)
Urged to quit 

## 2015-05-07 NOTE — Assessment & Plan Note (Signed)
stable overall by history and exam, recent data reviewed with pt, and pt to continue medical treatment as before,  to f/u any worsening symptoms or concerns SpO2 Readings from Last 3 Encounters:  05/07/15 97%  04/30/14 95%  10/12/11 97%

## 2015-05-07 NOTE — Addendum Note (Signed)
Addended by: Lowella Dandy on: 05/07/2015 02:01 PM   Modules accepted: Orders

## 2015-05-15 ENCOUNTER — Other Ambulatory Visit: Payer: Self-pay | Admitting: Internal Medicine

## 2015-05-18 ENCOUNTER — Other Ambulatory Visit: Payer: Self-pay | Admitting: Internal Medicine

## 2015-09-22 ENCOUNTER — Ambulatory Visit (INDEPENDENT_AMBULATORY_CARE_PROVIDER_SITE_OTHER): Payer: Medicare Other

## 2015-09-22 DIAGNOSIS — Z23 Encounter for immunization: Secondary | ICD-10-CM | POA: Diagnosis not present

## 2016-05-04 ENCOUNTER — Other Ambulatory Visit (INDEPENDENT_AMBULATORY_CARE_PROVIDER_SITE_OTHER): Payer: Medicare Other

## 2016-05-04 DIAGNOSIS — Z0000 Encounter for general adult medical examination without abnormal findings: Secondary | ICD-10-CM

## 2016-05-04 DIAGNOSIS — R7302 Impaired glucose tolerance (oral): Secondary | ICD-10-CM

## 2016-05-04 LAB — URINALYSIS, ROUTINE W REFLEX MICROSCOPIC
Bilirubin Urine: NEGATIVE
Hgb urine dipstick: NEGATIVE
Ketones, ur: NEGATIVE
LEUKOCYTES UA: NEGATIVE
Nitrite: NEGATIVE
RBC / HPF: NONE SEEN (ref 0–?)
SPECIFIC GRAVITY, URINE: 1.025 (ref 1.000–1.030)
URINE GLUCOSE: NEGATIVE
UROBILINOGEN UA: 0.2 (ref 0.0–1.0)
pH: 5.5 (ref 5.0–8.0)

## 2016-05-04 LAB — BASIC METABOLIC PANEL
BUN: 20 mg/dL (ref 6–23)
CHLORIDE: 104 meq/L (ref 96–112)
CO2: 25 mEq/L (ref 19–32)
CREATININE: 0.92 mg/dL (ref 0.40–1.50)
Calcium: 8.2 mg/dL — ABNORMAL LOW (ref 8.4–10.5)
GFR: 84.7 mL/min (ref >60.00)
GLUCOSE: 80 mg/dL (ref 70–99)
Potassium: 4.3 mEq/L (ref 3.5–5.1)
Sodium: 137 mEq/L (ref 135–145)

## 2016-05-04 LAB — HEPATIC FUNCTION PANEL
ALBUMIN: 4.2 g/dL (ref 3.5–5.2)
ALT: 22 U/L (ref 0–53)
AST: 24 U/L (ref 0–37)
Alkaline Phosphatase: 90 U/L (ref 39–117)
BILIRUBIN DIRECT: 0.1 mg/dL (ref 0.0–0.3)
TOTAL PROTEIN: 6.2 g/dL (ref 6.0–8.3)
Total Bilirubin: 0.5 mg/dL (ref 0.2–1.2)

## 2016-05-04 LAB — LIPID PANEL
Cholesterol: 123 mg/dL (ref 0–200)
HDL: 57.3 mg/dL (ref >39.00)
LDL Cholesterol: 52 mg/dL (ref 0–99)
NonHDL: 65.41
TRIGLYCERIDES: 67 mg/dL (ref 0.0–149.0)
Total CHOL/HDL Ratio: 2
VLDL: 13.4 mg/dL (ref 0.0–40.0)

## 2016-05-04 LAB — CBC WITH DIFFERENTIAL/PLATELET
BASOS ABS: 0 10*3/uL (ref 0.0–0.1)
Basophils Relative: 0.4 % (ref 0.0–3.0)
EOS ABS: 0.1 10*3/uL (ref 0.0–0.7)
Eosinophils Relative: 0.7 % (ref 0.0–5.0)
HEMATOCRIT: 43.8 % (ref 39.0–52.0)
Hemoglobin: 14.6 g/dL (ref 13.0–17.0)
LYMPHS PCT: 22.8 % (ref 12.0–46.0)
Lymphs Abs: 1.8 10*3/uL (ref 0.7–4.0)
MCHC: 33.4 g/dL (ref 30.0–36.0)
MCV: 106.6 fl — ABNORMAL HIGH (ref 78.0–100.0)
Monocytes Absolute: 0.8 10*3/uL (ref 0.1–1.0)
Monocytes Relative: 10.8 % (ref 3.0–12.0)
Neutro Abs: 5 10*3/uL (ref 1.4–7.7)
Neutrophils Relative %: 65.3 % (ref 43.0–77.0)
PLATELETS: 307 10*3/uL (ref 150.0–400.0)
RBC: 4.11 Mil/uL — AB (ref 4.22–5.81)
RDW: 16.9 % — ABNORMAL HIGH (ref 11.5–15.5)
WBC: 7.7 10*3/uL (ref 4.0–10.5)

## 2016-05-04 LAB — TSH: TSH: 1.81 u[IU]/mL (ref 0.35–4.50)

## 2016-05-04 LAB — PSA: PSA: 0.77 ng/mL (ref 0.10–4.00)

## 2016-05-04 LAB — HEMOGLOBIN A1C: Hgb A1c MFr Bld: 5.5 % (ref 4.6–6.5)

## 2016-05-11 ENCOUNTER — Encounter: Payer: Self-pay | Admitting: Internal Medicine

## 2016-05-11 ENCOUNTER — Ambulatory Visit (INDEPENDENT_AMBULATORY_CARE_PROVIDER_SITE_OTHER)
Admission: RE | Admit: 2016-05-11 | Discharge: 2016-05-11 | Disposition: A | Discharge status: Home / Self Care | Payer: Medicare Other | Source: Ambulatory Visit | Attending: Internal Medicine | Admitting: Internal Medicine

## 2016-05-11 ENCOUNTER — Ambulatory Visit (INDEPENDENT_AMBULATORY_CARE_PROVIDER_SITE_OTHER): Payer: Medicare Other | Admitting: Internal Medicine

## 2016-05-11 VITALS — BP 122/60 | HR 82 | Temp 98.3°F | Resp 20 | Wt 117.0 lb

## 2016-05-11 DIAGNOSIS — Z0001 Encounter for general adult medical examination with abnormal findings: Secondary | ICD-10-CM | POA: Diagnosis not present

## 2016-05-11 DIAGNOSIS — M545 Low back pain, unspecified: Secondary | ICD-10-CM

## 2016-05-11 DIAGNOSIS — D1779 Benign lipomatous neoplasm of other sites: Secondary | ICD-10-CM

## 2016-05-11 DIAGNOSIS — R6889 Other general symptoms and signs: Secondary | ICD-10-CM | POA: Diagnosis not present

## 2016-05-11 DIAGNOSIS — J449 Chronic obstructive pulmonary disease, unspecified: Secondary | ICD-10-CM | POA: Diagnosis not present

## 2016-05-11 DIAGNOSIS — D171 Benign lipomatous neoplasm of skin and subcutaneous tissue of trunk: Secondary | ICD-10-CM | POA: Insufficient documentation

## 2016-05-11 DIAGNOSIS — R7302 Impaired glucose tolerance (oral): Secondary | ICD-10-CM

## 2016-05-11 DIAGNOSIS — Z720 Tobacco use: Secondary | ICD-10-CM | POA: Diagnosis not present

## 2016-05-11 MED ORDER — TIOTROPIUM BROMIDE MONOHYDRATE 18 MCG IN CAPS: ORAL_CAPSULE | RESPIRATORY_TRACT | Status: DC

## 2016-05-11 MED ORDER — VARENICLINE TARTRATE 0.5 MG X 11 & 1 MG X 42 PO MISC: ORAL | Status: DC

## 2016-05-11 MED ORDER — BUDESONIDE-FORMOTEROL FUMARATE 160-4.5 MCG/ACT IN AERO: 2.0000 | INHALATION_SPRAY | Freq: Two times a day (BID) | RESPIRATORY_TRACT | Status: DC

## 2016-05-11 MED ORDER — VARENICLINE TARTRATE 1 MG PO TABS: 1.0000 mg | ORAL_TABLET | Freq: Two times a day (BID) | ORAL | Status: DC

## 2016-05-11 NOTE — Progress Notes (Signed)
Pre visit review using our clinic review tool, if applicable. No additional management support is needed unless otherwise documented below in the visit note. 

## 2016-05-11 NOTE — Progress Notes (Signed)
Subjective:    Patient ID: Vincent [name], male    DOB: April 23, 1936, 80 y.o.   MRN: [id number]  HPI  Here for wellness and f/u;  Overall doing ok;  Pt denies Chest pain, orthopnea, PND, worsening LE edema, palpitations, dizziness or syncope.  Pt denies neurological change such as new headache, facial or extremity weakness.  Pt denies polydipsia, polyuria, or low sugar symptoms. Pt states overall good compliance with treatment and medications, good tolerability, and has been trying to follow appropriate diet.  Pt denies worsening depressive symptoms, suicidal ideation or panic. No fever, night sweats, wt loss, loss of appetite, or other constitutional symptoms.  Pt states good ability with ADL's, has low fall risk, home safety reviewed and adequate, no other significant changes in hearing or vision, and only occasionally active with exercise. Due for derm f/u aug, then optho exam sept 2017.  Does have hx of COPD, and c/o persistent mild nonworsening sob/doe with occas wheeze..  Wants to quit smoking, asks for chantix as he is ready to try.  Pt continues to have recurring LBP without change in severity, bowel or bladder change, fever, wt loss,  worsening LE pain/numbness/weakness, gait change or falls.  Also has a soft fatty mass to lower back, no change in size but wondering if related to the pain Past Medical History  Diagnosis Date  . COLONIC POLYPS, HX OF 07/23/2008  . COPD (chronic obstructive pulmonary disease) ([place]) 10/12/2011  . Tobacco abuse 10/12/2011  . Impaired glucose tolerance 10/12/2011  . Skin cancer 10/12/2011   No past surgical history on file.  reports that he has been smoking.  He does not have any smokeless tobacco history on file. He reports that he drinks alcohol. He reports that he does not use illicit drugs. family history includes Cancer in his mother; Heart disease in his father. No Known Allergies Current Outpatient Prescriptions on File Prior to Visit  Medication Sig  Dispense Refill  . aspirin 81 MG EC tablet TAKE 1 TABLET (81 MG TOTAL) BY MOUTH DAILY. 90 tablet 3   No current facility-administered medications on file prior to visit.    Review of Systems Constitutional: Negative for increased diaphoresis, or other activity, appetite or siginficant weight change other than noted HENT: Negative for worsening hearing loss, ear pain, facial swelling, mouth sores and neck stiffness.   Eyes: Negative for other worsening pain, redness or visual disturbance.  Respiratory: Negative for choking or stridor Cardiovascular: Negative for other chest pain and palpitations.  Gastrointestinal: Negative for worsening diarrhea, blood in stool, or abdominal distention Genitourinary: Negative for hematuria, flank pain or change in urine volume.  Musculoskeletal: Negative for myalgias or other joint complaints.  Skin: Negative for other color change and wound or drainage.  Neurological: Negative for syncope and numbness. other than noted Hematological: Negative for adenopathy. or other swelling Psychiatric/Behavioral: Negative for hallucinations, SI, self-injury, decreased concentration or other worsening agitation.      Objective:   Physical Exam BP 122/60 mmHg  Pulse 82  Temp(Src) 98.3 F (36.8 C) (Oral)  Resp 20  Wt 117 lb (53.071 kg)  SpO2 90% VS noted,  Constitutional: Pt is oriented to person, place, and time. Appears well-developed and well-nourished, in no significant distress Head: Normocephalic and atraumatic  Eyes: Conjunctivae and EOM are normal. Pupils are equal, [name], and reactive to light Right Ear: External ear normal.  Left Ear: External ear normal Nose: Nose normal.  Mouth/Throat: Oropharynx is clear and moist  Neck: Normal  range of motion. Neck supple. No JVD present. No tracheal deviation present or significant neck LA or mass Cardiovascular: Normal rate, regular rhythm, normal heart sounds and intact distal pulses.   Pulmonary/Chest:  Effort normal and breath sounds decreased without rales or wheezing  Abdominal: Soft. Bowel sounds are normal. NT. No HSM  Musculoskeletal: Normal range of motion. Exhibits no edema Lymphadenopathy: Has no cervical adenopathy.  Neurological: Pt is alert and oriented to person, place, and time. Pt has normal reflexes. No cranial nerve deficit. Motor grossly intact Skin: Skin is warm and dry. No rash noted or new ulcers, has lipoma like mass approx 3 cm in left lumbar region, NT Psychiatric:  Has normal mood and affect. Behavior is normal. \ Spine nontender    Assessment & Plan:

## 2016-05-11 NOTE — Patient Instructions (Addendum)
Please take all new medication as prescribed - the symbicort inhaler (done on the computer to the pharamc), and the chantix (done hardcopy  Please continue all other medications as before, and refills have been done if requested.  Please have the pharmacy call with any other refills you may need.  Please continue your efforts at being more active, low cholesterol diet, and weight control.  You are otherwise up to date with prevention measures today.  Please keep your appointments with your specialists as you may have planned  Please go to the XRAY Department in the Basement (go straight as you get off the elevator) for the x-ray testing  You will be contacted by phone if any changes need to be made immediately.  Otherwise, you will receive a letter about your results with an explanation, but please check with MyChart first.  Please remember to sign up for MyChart if you have not done so, as this will be important to you in the future with finding out test results, communicating by private email, and scheduling acute appointments online when needed.  Please return in 6 months, or sooner if needed

## 2016-05-12 ENCOUNTER — Encounter: Payer: Self-pay | Admitting: Internal Medicine

## 2016-05-15 NOTE — Assessment & Plan Note (Signed)

## 2016-05-15 NOTE — Assessment & Plan Note (Signed)
stable overall by history and exam, most likely related to underlying djd/ddd, consider ls spine films, tylenol prn, and pt to continue medical treatment as before,  to f/u any worsening symptoms or concerns,

## 2016-05-15 NOTE — Assessment & Plan Note (Signed)
stable overall by history and exam, recent data reviewed with pt, and pt to continue medical treatment as before,  to f/u any worsening symptoms or concerns / Lab Results  Component Value Date   HGBA1C 5.5 05/04/2016

## 2016-05-15 NOTE — Assessment & Plan Note (Signed)
Ready to quit, ok for chantix asd,  to f/u any worsening symptoms or concerns

## 2016-05-15 NOTE — Assessment & Plan Note (Addendum)
Mild persistent symptoms, for cxr, add symbicort o/w stable overall by history and exam, recent data reviewed with pt,  to f/u any worsening symptoms or concerns SpO2 Readings from Last 3 Encounters:  05/11/16 90%  05/07/15 97%  04/30/14 95%   In addition to the time spent performing CPE, I spent an additional 40 minutes face to face,in which greater than 50% of this time was spent in counseling and coordination of care for patient's acute illness as documented.

## 2016-05-15 NOTE — Assessment & Plan Note (Signed)
No recent change, d/w pt, not likely source of pain, ok to follow for now, consider gen surgury referral if enlarges

## 2016-08-17 ENCOUNTER — Encounter: Payer: Self-pay | Admitting: Internal Medicine

## 2016-10-03 ENCOUNTER — Ambulatory Visit (INDEPENDENT_AMBULATORY_CARE_PROVIDER_SITE_OTHER): Payer: Medicare Other

## 2016-10-03 DIAGNOSIS — Z23 Encounter for immunization: Secondary | ICD-10-CM | POA: Diagnosis not present

## 2016-11-11 ENCOUNTER — Ambulatory Visit (INDEPENDENT_AMBULATORY_CARE_PROVIDER_SITE_OTHER): Payer: Medicare Other | Admitting: Internal Medicine

## 2016-11-11 VITALS — BP 124/72 | HR 80 | Temp 97.9°F | Resp 16 | Wt 119.0 lb

## 2016-11-11 DIAGNOSIS — Z720 Tobacco use: Secondary | ICD-10-CM

## 2016-11-11 DIAGNOSIS — Z0001 Encounter for general adult medical examination with abnormal findings: Secondary | ICD-10-CM | POA: Diagnosis not present

## 2016-11-11 DIAGNOSIS — D7589 Other specified diseases of blood and blood-forming organs: Secondary | ICD-10-CM

## 2016-11-11 DIAGNOSIS — R7302 Impaired glucose tolerance (oral): Secondary | ICD-10-CM | POA: Diagnosis not present

## 2016-11-11 DIAGNOSIS — J449 Chronic obstructive pulmonary disease, unspecified: Secondary | ICD-10-CM

## 2016-11-11 MED ORDER — ALBUTEROL SULFATE HFA 108 (90 BASE) MCG/ACT IN AERS: 2.0000 | INHALATION_SPRAY | Freq: Four times a day (QID) | RESPIRATORY_TRACT | 11 refills | Status: DC | PRN

## 2016-11-11 NOTE — Progress Notes (Signed)
Subjective:    Patient ID: Vincent [name], male    DOB: November 19, 1936, 80 y.o.   MRN: [id number]  HPI  Here to f/u; overall doing ok,  Pt denies chest pain, increasing sob or doe, wheezing, orthopnea, PND, increased LE swelling, palpitations, dizziness or syncope.  Pt denies new neurological symptoms such as new headache, or facial or extremity weakness or numbness.  Pt denies polydipsia, polyuria, or low sugar episode.   Pt denies new neurological symptoms such as new headache, or facial or extremity weakness or numbness.   Pt states overall good compliance with meds, mostly trying to follow appropriate diet, with wt overall stable,  Wt Readings from Last 3 Encounters:  11/11/16 119 lb (54 kg)  05/11/16 117 lb (53.1 kg)  05/07/15 121 lb 3.2 oz (55 kg)  And trying to stay fairly active, Declines claudiation symtpoms or LE pain.   Did not tolerate the third month of chantix so stopped, then wife died, and starting smoking again, thinks may be able to quit completely again.  Admits diet is not as well since wife died Past Medical History:  Diagnosis Date  . COLONIC POLYPS, HX OF 07/23/2008  . COPD (chronic obstructive pulmonary disease) ([place]) 10/12/2011  . Impaired glucose tolerance 10/12/2011  . Skin cancer 10/12/2011  . Tobacco abuse 10/12/2011   No past surgical history on file.  reports that he has been smoking.  He does not have any smokeless tobacco history on file. He reports that he drinks alcohol. He reports that he does not use drugs. family history includes Cancer in his mother; Heart disease in his father. No Known Allergies Current Outpatient Prescriptions on File Prior to Visit  Medication Sig Dispense Refill  . aspirin 81 MG EC tablet TAKE 1 TABLET (81 MG TOTAL) BY MOUTH DAILY. 90 tablet 3  . budesonide-formoterol (SYMBICORT) 160-4.5 MCG/ACT inhaler Inhale 2 puffs into the lungs 2 (two) times daily. 1 Inhaler 3  . tiotropium (SPIRIVA HANDIHALER) 18 MCG inhalation capsule  PLACE 1 CAPSULE (18 MCG TOTAL) INTO INHALER AND INHALE DAILY. 90 capsule 11   No current facility-administered medications on file prior to visit.    Review of Systems  Constitutional: Negative for unusual diaphoresis or night sweats HENT: Negative for ear swelling or discharge Eyes: Negative for worsening visual haziness  Respiratory: Negative for choking and stridor.   Gastrointestinal: Negative for distension or worsening eructation Genitourinary: Negative for retention or change in urine volume.  Musculoskeletal: Negative for other MSK pain or swelling Skin: Negative for color change and worsening wound Neurological: Negative for tremors and numbness other than noted  Psychiatric/Behavioral: Negative for decreased concentration or agitation other than above   All other system neg per pt    Objective:   Physical Exam BP 124/72   Pulse 80   Temp 97.9 F (36.6 C) (Oral)   Resp 16   Wt 119 lb (54 kg)   SpO2 95%   BMI 17.57 kg/m  VS noted, not ill appearing Constitutional: Pt appears in no apparent distress HENT: Head: NCAT.  Right Ear: External ear normal.  Left Ear: External ear normal.  Eyes: . Pupils are equal, [name], and reactive to light. Conjunctivae and EOM are normal Neck: Normal range of motion. Neck supple.  Cardiovascular: Normal rate and regular rhythm.   Pulmonary/Chest: Effort normal and breath sounds decreased without rales or wheezing.  Abd:  Soft, NT, ND, + BS Neurological: Pt is alert. Not confused , motor grossly intact Skin:  Skin is warm. No rash, no LE edema Psychiatric: Pt behavior is normal. No agitation. not overly nervous or depressed affect    Assessment & Plan:

## 2016-11-11 NOTE — Assessment & Plan Note (Signed)
Overall stable, could use an Albut HFA prn, to o/w cont same tx

## 2016-11-11 NOTE — Progress Notes (Signed)
Pre visit review using our clinic review tool, if applicable. No additional management support is needed unless otherwise documented below in the visit note. 

## 2016-11-11 NOTE — Assessment & Plan Note (Signed)
Did not tolerate chantix, back to smoking, urged pt to quit

## 2016-11-11 NOTE — Patient Instructions (Addendum)
Please take all new medication as prescribed - the albuterol inhaler only for worsening shortness of breath or wheezing  Please start OTC Vitamin B12  - 1 per day  Please continue all other medications as before, and refills have been done if requested.  Please have the pharmacy call with any other refills you may need.  Please continue your efforts at being more active, low cholesterol diet, and weight control.  Please keep your appointments with your specialists as you may have planned  Please return in 6 months, or sooner if needed, with Lab testing done 3-5 days before

## 2016-11-11 NOTE — Assessment & Plan Note (Signed)
Mild, chronic persistent, etiology unclear, ? b12 vs other underlying such as liver dz, doubt med related; ok for trial OTC b12 to next visit with f/u labs Lab Results  Component Value Date   WBC 7.7 05/04/2016   HGB 14.6 05/04/2016   HCT 43.8 05/04/2016   MCV 106.6 (H) 05/04/2016   PLT 307.0 05/04/2016

## 2016-11-11 NOTE — Assessment & Plan Note (Signed)
stable overall by history and exam, recent data reviewed with pt, and pt to continue medical treatment as before,  to f/u any worsening symptoms or concerns Lab Results  Component Value Date   HGBA1C 5.5 05/04/2016

## 2017-05-01 ENCOUNTER — Other Ambulatory Visit (INDEPENDENT_AMBULATORY_CARE_PROVIDER_SITE_OTHER): Payer: Medicare Other

## 2017-05-01 ENCOUNTER — Telehealth: Payer: Self-pay

## 2017-05-01 DIAGNOSIS — R7302 Impaired glucose tolerance (oral): Secondary | ICD-10-CM

## 2017-05-01 DIAGNOSIS — Z0001 Encounter for general adult medical examination with abnormal findings: Secondary | ICD-10-CM | POA: Diagnosis not present

## 2017-05-01 LAB — LIPID PANEL
CHOLESTEROL: 134 mg/dL (ref 0–200)
HDL: 67.6 mg/dL (ref >39.00)
LDL CALC: 51 mg/dL (ref 0–99)
NonHDL: 66.17
TRIGLYCERIDES: 74 mg/dL (ref 0.0–149.0)
Total CHOL/HDL Ratio: 2
VLDL: 14.8 mg/dL (ref 0.0–40.0)

## 2017-05-01 LAB — CBC WITH DIFFERENTIAL/PLATELET
BASOS ABS: 0.1 10*3/uL (ref 0.0–0.1)
BASOS PCT: 0.9 % (ref 0.0–3.0)
EOS ABS: 0.2 10*3/uL (ref 0.0–0.7)
Eosinophils Relative: 2.7 % (ref 0.0–5.0)
HEMATOCRIT: 43.7 % (ref 39.0–52.0)
HEMOGLOBIN: 14.4 g/dL (ref 13.0–17.0)
LYMPHS PCT: 23.1 % (ref 12.0–46.0)
Lymphs Abs: 1.5 10*3/uL (ref 0.7–4.0)
MCHC: 32.9 g/dL (ref 30.0–36.0)
MCV: 94.9 fl (ref 78.0–100.0)
MONO ABS: 0.9 10*3/uL (ref 0.1–1.0)
Monocytes Relative: 13.9 % — ABNORMAL HIGH (ref 3.0–12.0)
Neutro Abs: 3.8 10*3/uL (ref 1.4–7.7)
Neutrophils Relative %: 59.4 % (ref 43.0–77.0)
Platelets: 337 10*3/uL (ref 150.0–400.0)
RBC: 4.6 Mil/uL (ref 4.22–5.81)
RDW: 14.9 % (ref 11.5–15.5)
WBC: 6.4 10*3/uL (ref 4.0–10.5)

## 2017-05-01 LAB — URINALYSIS, ROUTINE W REFLEX MICROSCOPIC
Bilirubin Urine: NEGATIVE
HGB URINE DIPSTICK: NEGATIVE
LEUKOCYTES UA: NEGATIVE
Nitrite: NEGATIVE
RBC / HPF: NONE SEEN (ref 0–?)
Specific Gravity, Urine: >=1.03 — AB (ref 1.000–1.030)
URINE GLUCOSE: NEGATIVE
Urobilinogen, UA: 0.2 (ref 0.0–1.0)
WBC, UA: NONE SEEN (ref 0–?)
pH: 5.5 (ref 5.0–8.0)

## 2017-05-01 LAB — HEPATIC FUNCTION PANEL
ALBUMIN: 4 g/dL (ref 3.5–5.2)
ALK PHOS: 83 U/L (ref 39–117)
ALT: 12 U/L (ref 0–53)
AST: 17 U/L (ref 0–37)
Bilirubin, Direct: 0.1 mg/dL (ref 0.0–0.3)
Total Bilirubin: 0.5 mg/dL (ref 0.2–1.2)
Total Protein: 6.1 g/dL (ref 6.0–8.3)

## 2017-05-01 LAB — TSH: TSH: 4.27 u[IU]/mL (ref 0.35–4.50)

## 2017-05-01 LAB — BASIC METABOLIC PANEL
BUN: 21 mg/dL (ref 6–23)
CALCIUM: 8.7 mg/dL (ref 8.4–10.5)
CO2: 28 meq/L (ref 19–32)
CREATININE: 1.18 mg/dL (ref 0.40–1.50)
Chloride: 104 mEq/L (ref 96–112)
GFR: 63.39 mL/min (ref >60.00)
GLUCOSE: 49 mg/dL — AB (ref 70–99)
Potassium: 5 mEq/L (ref 3.5–5.1)
SODIUM: 138 meq/L (ref 135–145)

## 2017-05-01 LAB — HEMOGLOBIN A1C: Hgb A1c MFr Bld: 5.6 % (ref 4.6–6.5)

## 2017-05-01 LAB — PSA: PSA: 0.84 ng/mL (ref 0.10–4.00)

## 2017-05-01 NOTE — Telephone Encounter (Signed)
Advised patient of dr Ronnald Ramp note/instructions---he has eaten and will recheck sugar if he feels bad, didn't want to recheck otherwise---he has appt next week with dr Jenny Reichmann already

## 2017-05-01 NOTE — Telephone Encounter (Signed)
He should eat/drink something with sugar and come back in for a blood sugar check

## 2017-05-01 NOTE — Telephone Encounter (Signed)
Recd call from [facility] with critical lab value resulting glucose value at 49----dr Jenny Reichmann is not in office today, routing to dr Ronnald Ramp, please advise in the absence of dr Jenny Reichmann, thanks

## 2017-05-11 ENCOUNTER — Encounter: Payer: Self-pay | Admitting: Internal Medicine

## 2017-05-11 ENCOUNTER — Ambulatory Visit (INDEPENDENT_AMBULATORY_CARE_PROVIDER_SITE_OTHER): Payer: Medicare Other | Admitting: Internal Medicine

## 2017-05-11 VITALS — BP 144/80 | HR 76 | Ht 70.0 in | Wt 126.0 lb

## 2017-05-11 DIAGNOSIS — J449 Chronic obstructive pulmonary disease, unspecified: Secondary | ICD-10-CM | POA: Diagnosis not present

## 2017-05-11 DIAGNOSIS — Z0000 Encounter for general adult medical examination without abnormal findings: Secondary | ICD-10-CM

## 2017-05-11 DIAGNOSIS — R7302 Impaired glucose tolerance (oral): Secondary | ICD-10-CM

## 2017-05-11 MED ORDER — TIOTROPIUM BROMIDE MONOHYDRATE 18 MCG IN CAPS: ORAL_CAPSULE | RESPIRATORY_TRACT | 11 refills | Status: DC

## 2017-05-11 MED ORDER — BUDESONIDE-FORMOTEROL FUMARATE 160-4.5 MCG/ACT IN AERO: 2.0000 | INHALATION_SPRAY | Freq: Two times a day (BID) | RESPIRATORY_TRACT | 3 refills | Status: DC

## 2017-05-11 MED ORDER — ALBUTEROL SULFATE HFA 108 (90 BASE) MCG/ACT IN AERS
2.0000 | INHALATION_SPRAY | Freq: Four times a day (QID) | RESPIRATORY_TRACT | 11 refills | Status: DC | PRN
Start: 2017-05-11 — End: 2017-11-23

## 2017-05-11 NOTE — Assessment & Plan Note (Signed)
Ok for cont'd meds as is for now, refer pulm

## 2017-05-11 NOTE — Assessment & Plan Note (Signed)

## 2017-05-11 NOTE — Progress Notes (Signed)
Subjective:    Patient ID: Vincent [name], male    DOB: 12/29/1935, 81 y.o.   MRN: [id number]  HPI  Here for wellness and f/u;  Overall doing ok;  Pt denies Chest pain, orthopnea, PND, worsening LE edema, palpitations, dizziness or syncope.  Pt denies neurological change such as new headache, facial or extremity weakness.  Pt denies polydipsia, polyuria, or low sugar symptoms. Pt states overall good compliance with treatment and medications, good tolerability, and has been trying to follow appropriate diet.  Pt denies worsening depressive symptoms, suicidal ideation or panic. No fever, night sweats, wt loss, loss of appetite, or other constitutional symptoms.  Pt states good ability with ADL's, has low fall risk, home safety reviewed and adequate, no other significant changes in hearing or vision, and only occasionally active with exercise.  Has had worsening sob/doe without fever, st or wheezing in last few wks.  Has not seen pulmonary in past.  Mows the yard about 15 min, rest 10, then back to mowing with sob and fatigue.  BP Readings from Last 3 Encounters:  05/11/17 (!) 144/80  11/11/16 124/72  05/11/16 122/60  Stopped smoking Jun 25, 2016, but wife died 2017-10-31so started back, then quit again mar 2018.  No other new hx Past Medical History:  Diagnosis Date  . COLONIC POLYPS, HX OF 07/23/2008  . COPD (chronic obstructive pulmonary disease) ([place]) 10/12/2011  . Impaired glucose tolerance 10/12/2011  . Skin cancer 10/12/2011  . Tobacco abuse 10/12/2011   No past surgical history on file.  reports that he has been smoking.  He has never used smokeless tobacco. He reports that he drinks alcohol. He reports that he does not use drugs. family history includes Cancer in his mother; Heart disease in his father. No Known Allergies Current Outpatient Prescriptions on File Prior to Visit  Medication Sig Dispense Refill  . aspirin 81 MG EC tablet TAKE 1 TABLET (81 MG TOTAL) BY MOUTH DAILY. 90  tablet 3   No current facility-administered medications on file prior to visit.     Review of Systems Constitutional: Negative for other unusual diaphoresis, sweats, appetite or weight changes HENT: Negative for other worsening hearing loss, ear pain, facial swelling, mouth sores or neck stiffness.   Eyes: Negative for other worsening pain, redness or other visual disturbance.  Respiratory: Negative for other stridor or swelling Cardiovascular: Negative for other palpitations or other chest pain  Gastrointestinal: Negative for worsening diarrhea or loose stools, blood in stool, distention or other pain Genitourinary: Negative for hematuria, flank pain or other change in urine volume.  Musculoskeletal: Negative for myalgias or other joint swelling.  Skin: Negative for other color change, or other wound or worsening drainage.  Neurological: Negative for other syncope or numbness. Hematological: Negative for other adenopathy or swelling Psychiatric/Behavioral: Negative for hallucinations, other worsening agitation, SI, self-injury, or new decreased concentration All other system neg per pt     Objective:   Physical Exam BP (!) 144/80   Pulse 76   Ht 5\' 10"  (1.778 m)   Wt 126 lb (57.2 kg)   SpO2 97%   BMI 18.08 kg/m  VS noted, thin, but able to easily ambulate and get up to exam table without assist Constitutional: Pt is oriented to person, place, and time. Appears well-developed and well-nourished, in no significant distress and comfortable Head: Normocephalic and atraumatic  Eyes: Conjunctivae and EOM are normal. Pupils are equal, [name], and reactive to light Right Ear: External ear normal  without discharge Left Ear: External ear normal without discharge Nose: Nose without discharge or deformity Mouth/Throat: Oropharynx is without other ulcerations and moist  Neck: Normal range of motion. Neck supple. No JVD present. No tracheal deviation present or significant neck LA or  mass Cardiovascular: Normal rate, regular rhythm, normal heart sounds and intact distal pulses.   Pulmonary/Chest: WOB normal and breath sounds decresaed without rales or wheezing  Abdominal: Soft. Bowel sounds are normal. NT. No HSM  Musculoskeletal: Normal range of motion. Exhibits no edema Lymphadenopathy: Has no other cervical adenopathy.  Neurological: Pt is alert and oriented to person, place, and time. Pt has normal reflexes. No cranial nerve deficit. Motor grossly intact, Gait intact Skin: Skin is warm and dry. No rash noted or new ulcerations Psychiatric:  Has normal mood and affect. Behavior is normal without agitation No other exam findings Lab Results  Component Value Date   WBC 6.4 05/01/2017   HGB 14.4 05/01/2017   HCT 43.7 05/01/2017   PLT 337.0 05/01/2017   GLUCOSE 49 (LL) 05/01/2017   CHOL 134 05/01/2017   TRIG 74.0 05/01/2017   HDL 67.60 05/01/2017   LDLCALC 51 05/01/2017   ALT 12 05/01/2017   AST 17 05/01/2017   NA 138 05/01/2017   K 5.0 05/01/2017   CL 104 05/01/2017   CREATININE 1.18 05/01/2017   BUN 21 05/01/2017   CO2 28 05/01/2017   TSH 4.27 05/01/2017   PSA 0.84 05/01/2017   HGBA1C 5.6 05/01/2017          Assessment & Plan:

## 2017-05-11 NOTE — Patient Instructions (Signed)
Please continue all other medications as before, and refills have been done if requested.  Please have the pharmacy call with any other refills you may need.  Please continue your efforts at being more active, low cholesterol diet, and weight control.  You are otherwise up to date with prevention measures today.  Please keep your appointments with your specialists as you may have planned  You will be contacted regarding the referral for: pulmonary  Please return in 1 year for your yearly visit, or sooner if needed, with Lab testing done 3-5 days before

## 2017-05-11 NOTE — Assessment & Plan Note (Signed)
Asympt, a1c normal, cont to follow

## 2017-06-19 ENCOUNTER — Ambulatory Visit (INDEPENDENT_AMBULATORY_CARE_PROVIDER_SITE_OTHER): Payer: Medicare Other | Admitting: Internal Medicine

## 2017-06-19 ENCOUNTER — Ambulatory Visit (INDEPENDENT_AMBULATORY_CARE_PROVIDER_SITE_OTHER)
Admission: RE | Admit: 2017-06-19 | Discharge: 2017-06-19 | Disposition: A | Discharge status: Home / Self Care | Payer: Medicare Other | Source: Ambulatory Visit | Attending: Internal Medicine | Admitting: Internal Medicine

## 2017-06-19 ENCOUNTER — Encounter: Payer: Self-pay | Admitting: Internal Medicine

## 2017-06-19 VITALS — BP 102/60 | HR 63 | Ht 70.0 in | Wt 118.0 lb

## 2017-06-19 DIAGNOSIS — J449 Chronic obstructive pulmonary disease, unspecified: Secondary | ICD-10-CM | POA: Diagnosis not present

## 2017-06-19 MED ORDER — TIOTROPIUM BROMIDE-OLODATEROL 2.5-2.5 MCG/ACT IN AERS: 2.0000 | INHALATION_SPRAY | Freq: Every day | RESPIRATORY_TRACT | 0 refills | Status: DC

## 2017-06-19 MED ORDER — TIOTROPIUM BROMIDE-OLODATEROL 2.5-2.5 MCG/ACT IN AERS
2.0000 | INHALATION_SPRAY | Freq: Every day | RESPIRATORY_TRACT | 11 refills | Status: AC
Start: 2017-06-19 — End: ?

## 2017-06-19 NOTE — Progress Notes (Signed)
Subjective:     Patient ID: Vincent [name], male   DOB: December 12, 1936,    MRN: [id number]  HPI  59 yowm quit smoking 04/2017 with dx copd "years back prior to computerized records" and h/o TB in his 55s in [place] rx medically x 9 month and cleared referred to pulmonary clinic 06/19/2017 by Dr   Vincent [name] for progressive doe with GOLD III COPD criteria 06/19/2017    06/19/2017 1st [facility] office visit/ Vincent [name]   Chief Complaint  Patient presents with  . Pulmonary Consult    Referred by Dr. Cathlean [name].  Pt c/o cough and SOB over the past few yrs.  He states he gets SOB walking one flight of stairs. He has prod cough with clear sputum.  He has an albuterol inhaler that he rarely uses.   on spiriva  Dpi daily  for several years and symbicort listed but he never started it  [facility] = can't walk a nl pace on a flat grade s sob but does fine slow and flat eg ht ok   No obvious day to day or daytime variability or assoc  purulent sputum or mucus plugs or hemoptysis or cp or chest tightness, subjective wheeze or overt sinus or hb symptoms. No unusual exp hx or h/o childhood pna/ asthma or knowledge of premature birth.  Sleeping ok without nocturnal  or early am exacerbation  of respiratory  c/o's or need for noct saba. Also denies any obvious fluctuation of symptoms with weather or environmental changes or other aggravating or alleviating factors except as outlined above   Current Medications, Allergies, Complete Past Medical History, Past Surgical History, Family History, and Social History were reviewed in [employer] record.  ROS  The following are not active complaints unless bolded sore throat, dysphagia, dental problems, itching, sneezing,  nasal congestion or excess/ purulent secretions, ear ache,   fever, chills, sweats, unintended wt loss, classically pleuritic or exertional cp,  orthopnea pnd or leg swelling, presyncope, palpitations, abdominal pain, anorexia, nausea,  vomiting, diarrhea  or change in bowel or bladder habits, change in stools or urine, dysuria,hematuria,  rash, arthralgias, visual complaints, headache, numbness, weakness or ataxia or problems with walking or coordination,  change in mood/affect or memory.            Review of Systems     Objective:   Physical Exam    thin chronically ill appearing amb wm nad  Wt Readings from Last 3 Encounters:  05/11/17 126 lb (57.2 kg)  11/11/16 119 lb (54 kg)  05/11/16 117 lb (53.1 kg)    Vital signs reviewed - Note on arrival 02 sats  95 % on RA     HEENT: nl dentition, turbinates bilaterally, and oropharynx. Nl external ear canals without cough reflex   NECK :  without JVD/Nodes/TM/ nl carotid upstrokes bilaterally   LUNGS: no acc muscle use,  slt barrel contour, distant bs bilaterally    CV:  RRR  no s3 or murmur or increase in P2, and no edema   ABD:  soft and nontender with late insp hoovers in the supine position. No bruits or organomegaly appreciated, bowel sounds nl  MS:  Nl gait/ ext warm without deformities, calf tenderness, cyanosis or clubbing No obvious joint restrictions   SKIN: warm and dry without lesions    NEURO:  alert, approp, nl sensorium with  no motor or cerebellar deficits apparent.     CXR PA and Lateral:   06/19/2017 :  I personally reviewed images and agree with radiology impression as follows:   Unchanged appearance of the lungs with persistent hyperinflation consistent with COPD.   HCO3  05/01/17 = 28    Assessment:

## 2017-06-19 NOTE — Patient Instructions (Addendum)
Plan A = Automatic = stop spriva and start stiolto 2 pffs each am  Work on inhaler technique:  relax and gently blow all the way out then take a nice smooth deep breath back in, triggering the inhaler at same time you start breathing in.  Hold for up to 5 seconds if you can.      Plan B = Backup Only use your albuterol as a rescue medication to be used if you can't catch your breath by resting or doing a relaxed purse lip breathing pattern.  - The less you use it, the better it will work when you need it. - Ok to use the inhaler up to 2 puffs  every 4 hours if you must but call for appointment if use goes up over your usual need - Don't leave home without it !!  (think of it like the spare tire for your car)    Please remember to go to the  x-ray department downstairs in the basement  for your tests - we will call you with the results when they are available.      Please schedule a follow up office visit in 6 weeks, call sooner if needed   Late add:  pfts on return

## 2017-06-19 NOTE — Progress Notes (Signed)
Spoke with pt and notified of results per Dr. Wert. Pt verbalized understanding and denied any questions. 

## 2017-06-20 ENCOUNTER — Telehealth: Payer: Self-pay | Admitting: *Deleted

## 2017-06-20 NOTE — Telephone Encounter (Signed)
Spoke with the pt and scheduled appt for PFT

## 2017-06-20 NOTE — Assessment & Plan Note (Signed)
Spirometry 06/19/2017  FEV1 1.42 (48%)  Ratio 44  p am spiriva - 06/19/2017  After extensive coaching device  effectiveness =    75% smi > try stiolto    Pt has classice COPD/e phenotype GOLD III / Group B in terms of symptom/risk and laba/lama therefore appropriate rx at this point in the absence of more of an AB pattern or h/o exacerbations  Formulary restrictions will be an ongoing challenge for the forseable future and I would be happy to pick an alternative if the pt will first  provide me a list of them but pt  will need to return here for training for any new device that is required eg dpi vs hfa vs respimat.    In meantime we can always provide samples so the patient never runs out of any needed respiratory medications.   Will have him return for full pfts at 6 weeks  Total time devoted to counseling  > 50 % of initial 60 min office visit:  review case with pt/ discussion of options/alternatives/ personally creating written customized instructions  in presence of pt  then going over those specific  Instructions directly with the pt including how to use all of the meds but in particular covering each new medication in detail and the difference between the maintenance= "automatic" meds and the prns using an action plan format for the latter (If this problem/symptom => do that organization reading Left to right).  Please see AVS from this visit for a full list of these instructions which I personally wrote for this pt and  are unique to this visit.

## 2017-06-20 NOTE — Telephone Encounter (Signed)
-----   Message from Tanda Rockers, MD sent at 06/20/2017  8:12 AM EDT ----- Needs pfts on return - ok to move back

## 2017-07-27 ENCOUNTER — Other Ambulatory Visit: Payer: Self-pay | Admitting: Internal Medicine

## 2017-07-27 DIAGNOSIS — J449 Chronic obstructive pulmonary disease, unspecified: Secondary | ICD-10-CM

## 2017-07-28 ENCOUNTER — Ambulatory Visit (INDEPENDENT_AMBULATORY_CARE_PROVIDER_SITE_OTHER): Payer: Medicare Other | Admitting: Internal Medicine

## 2017-07-28 DIAGNOSIS — J449 Chronic obstructive pulmonary disease, unspecified: Secondary | ICD-10-CM

## 2017-07-28 LAB — PULMONARY FUNCTION TEST
DL/VA % PRED: 19 %
DL/VA: 0.9 ml/min/mmHg/L
DLCO unc % pred: 18 %
DLCO unc: 5.95 ml/min/mmHg
FEF 25-75 PRE: 0.7 L/s
FEF 25-75 Post: 0.67 L/sec
FEF2575-%Change-Post: -4 %
FEF2575-%PRED-PRE: 34 %
FEF2575-%Pred-Post: 32 %
FEV1-%Change-Post: -9 %
FEV1-%Pred-Post: 47 %
FEV1-%Pred-Pre: 52 %
FEV1-PRE: 1.54 L
FEV1-Post: 1.39 L
FEV1FVC-%Change-Post: -9 %
FEV1FVC-%PRED-PRE: 67 %
FEV6-%CHANGE-POST: -2 %
FEV6-%PRED-POST: 79 %
FEV6-%PRED-PRE: 81 %
FEV6-POST: 3.03 L
FEV6-Pre: 3.12 L
FEV6FVC-%CHANGE-POST: -1 %
FEV6FVC-%PRED-POST: 104 %
FEV6FVC-%Pred-Pre: 105 %
FVC-%CHANGE-POST: 0 %
FVC-%PRED-POST: 77 %
FVC-%Pred-Pre: 77 %
FVC-Post: 3.16 L
FVC-Pre: 3.18 L
POST FEV6/FVC RATIO: 97 %
PRE FEV1/FVC RATIO: 49 %
Post FEV1/FVC ratio: 44 %
Pre FEV6/FVC Ratio: 98 %

## 2017-07-28 NOTE — Progress Notes (Signed)
PFT done today. 

## 2017-07-31 ENCOUNTER — Encounter: Payer: Self-pay | Admitting: Internal Medicine

## 2017-07-31 ENCOUNTER — Other Ambulatory Visit: Payer: Medicare Other

## 2017-07-31 ENCOUNTER — Ambulatory Visit (INDEPENDENT_AMBULATORY_CARE_PROVIDER_SITE_OTHER): Payer: Medicare Other | Admitting: Internal Medicine

## 2017-07-31 VITALS — BP 102/60 | HR 90 | Ht 70.0 in | Wt 115.6 lb

## 2017-07-31 DIAGNOSIS — F17210 Nicotine dependence, cigarettes, uncomplicated: Secondary | ICD-10-CM

## 2017-07-31 DIAGNOSIS — J449 Chronic obstructive pulmonary disease, unspecified: Secondary | ICD-10-CM

## 2017-07-31 NOTE — Patient Instructions (Signed)
Please remember to go to the lab  department downstairs in the basement  for your tests - we will call you with the results when they are available.  Continue stioloto 2 pffs when your first get up    The key is to stop smoking completely before smoking completely stops you!    Please schedule a follow up visit in 6  months but call sooner if needed

## 2017-07-31 NOTE — Progress Notes (Signed)
Subjective:     Patient ID: Vincent [name], male   DOB: 08/01/1936,    MRN: [id number]    Brief patient profile:  69 yowm quit smoking 04/2017 with dx copd "years back prior to computerized records" and h/o TB in his 61s in [place] rx medically x 9 month and cleared referred to pulmonary clinic 06/19/2017 by Dr   Jenny Reichmann for progressive doe with GOLD III COPD criteria 06/19/2017    History of Present Illness  06/19/2017 1st [facility] office visit/ Vincent [name]   Chief Complaint  Patient presents with  . Pulmonary Consult    Referred by Dr. Cathlean Cower.  Pt c/o cough and SOB over the past few yrs.  He states he gets SOB walking one flight of stairs. He has prod cough with clear sputum.  He has an albuterol inhaler that he rarely uses.   on spiriva  Dpi daily  for several years and symbicort listed but he never started it  [facility] = can't walk a nl pace on a flat grade s sob but does fine slow and flat eg ht ok  rec Plan A = Automatic = stop spriva and start stiolto 2 pffs each am Work on inhaler technique:  relax and gently blow all the way out then take a nice smooth deep breath back in, triggering the inhaler at same time you start breathing in.  Hold for up to 5 seconds if you can.   Plan B = Backup Only use your albuterol as a rescue medication to be used if you can't catch your breath  Please remember to go to the  x-ray department downstairs in the basement  for your tests - we will call you with the results when they are available.  Please schedule a follow up office visit in 6 weeks, call sooner if needed   Late add:  pfts on return     07/31/2017  f/u ov/Meagan Spease re:  Copd GOLD II/III on stiolto  Chief Complaint  Patient presents with  . Follow-up    PFT's done on 05/28/17. Pt states his breathing is unchanged. He has not used his albuterol. No new co's today. He has resumed smoking again since last visit- a few cigs per day.    doe = MMRC2 = can't walk a nl pace on a flat grade s sob but does  fine slow and flat - shops anywhere he wants to go Sleeping fine flat/ RA/ no saba need  No obvious day to day or daytime variability or assoc excess/ purulent sputum or mucus plugs or hemoptysis or cp or chest tightness, subjective wheeze or overt sinus or hb symptoms. No unusual exp hx or h/o childhood pna/ asthma or knowledge of premature birth.  Sleeping ok without nocturnal  or early am exacerbation  of respiratory  c/o's or need for noct saba. Also denies any obvious fluctuation of symptoms with weather or environmental changes or other aggravating or alleviating factors except as outlined above   Current Medications, Allergies, Complete Past Medical History, Past Surgical History, Family History, and Social History were reviewed in [employer] record.  ROS  The following are not active complaints unless bolded sore throat, dysphagia, dental problems, itching, sneezing,  nasal congestion or excess/ purulent secretions, ear ache,   fever, chills, sweats, unintended wt loss, classically pleuritic or exertional cp,  orthopnea pnd or leg swelling, presyncope, palpitations, abdominal pain, anorexia, nausea, vomiting, diarrhea  or change in bowel or bladder habits,  change in stools or urine, dysuria,hematuria,  rash, arthralgias, visual complaints, headache, numbness, weakness or ataxia or problems with walking or coordination,  change in mood/affect or memory.               Objective:   Physical Exam    thin chronically ill appearing amb wm nad   07/31/2017       115  05/11/17 126 lb (57.2 kg)  11/11/16 119 lb (54 kg)  05/11/16 117 lb (53.1 kg)    Vital signs reviewed - Note on arrival 02 sats  96 % on RA     HEENT: nl dentition, turbinates bilaterally, and oropharynx. Nl external ear canals without cough reflex   NECK :  without JVD/Nodes/TM/ nl carotid upstrokes bilaterally   LUNGS: no acc muscle use,  slt barrel contour, distant bs bilaterally    CV:   RRR  no s3 or murmur or increase in P2, and no edema   ABD:  soft and nontender with late insp hoovers in the supine position. No bruits or organomegaly appreciated, bowel sounds nl  MS:  Nl gait/ ext warm without deformities, calf tenderness, cyanosis or clubbing No obvious joint restrictions   SKIN: warm and dry without lesions    NEURO:  alert, approp, nl sensorium with  no motor or cerebellar deficits apparent.     CXR PA and Lateral:   06/19/2017 :    I personally reviewed images and agree with radiology impression as follows:   Unchanged appearance of the lungs with persistent hyperinflation consistent with COPD.   Labs ordered 07/31/2017   Alpha one screen    Assessment:

## 2017-07-31 NOTE — Assessment & Plan Note (Addendum)
>   3 min   I reviewed the Fletcher curve with the patient that basically indicates  if you quit smoking when your best day FEV1 is still  preserved (as is only relatively still the case here)  it is highly unlikely you will progress to more severe disease and informed the patient there was  no medication on the market that has proven to alter the curve/ its downward trajectory  or the likelihood of progression of their disease(unlike other chronic medical conditions such as atheroclerosis where we do think we can change the natural hx with risk reducing meds)    Therefore stopping smoking and maintaining abstinence is the most important aspect of care, not choice of inhalers or for that matter, doctors.  Marland Kitchen

## 2017-07-31 NOTE — Assessment & Plan Note (Addendum)
Spirometry 06/19/2017  FEV1 1.42 (48%)  Ratio 44  p am spiriva - 06/19/2017  After extensive coaching device  effectiveness =    75% smi > try stiolto  - PFT's  07/31/2017  FEV1 1.54 (52 % ) ratio 49  p no % improvement from saba p stiolto then night  prior to study with DLCO  18 % corrects to 19  % for alv volume    Pt is Group B in terms of symptom/risk and laba/lama therefore appropriate rx at this point = stiolto approp Formulary restrictions will be an ongoing challenge for the forseable future and I would be happy to pick an alternative if the pt will first  provide me a list of them but pt  will need to return here for training for any new device that is required eg dpi vs hfa vs respimat.    In meantime we can always provide samples so the patient never runs out of any needed respiratory medications.   Needs alpha one screen   Nees to quit smoking (see separate a/p)    I had an extended discussion with the patient reviewing all relevant studies completed to date and  lasting 15 to 20 minutes of a 25 minute visit    Each maintenance medication was reviewed in detail including most importantly the difference between maintenance and prns and under what circumstances the prns are to be triggered using an action plan format that is not reflected in the computer generated alphabetically organized AVS.    Please see AVS for specific instructions unique to this visit that I personally wrote and verbalized to the the pt in detail and then reviewed with pt  by my nurse highlighting any  changes in therapy recommended at today's visit to their plan of care.

## 2017-08-02 LAB — ALPHA-1-ANTITRYPSIN: A-1 Antitrypsin, Ser: 173 mg/dL (ref 83–199)

## 2017-08-03 LAB — ALPHA-1 ANTITRYPSIN PHENOTYPE: A1 ANTITRYPSIN: 169 mg/dL (ref 83–199)

## 2017-08-04 NOTE — Progress Notes (Signed)
Spoke with pt and notified of results per Dr. Wert. Pt verbalized understanding and denied any questions. 

## 2017-09-19 ENCOUNTER — Ambulatory Visit (INDEPENDENT_AMBULATORY_CARE_PROVIDER_SITE_OTHER): Payer: Medicare Other | Admitting: Emergency Medicine

## 2017-09-19 DIAGNOSIS — Z23 Encounter for immunization: Secondary | ICD-10-CM | POA: Diagnosis not present

## 2017-09-20 ENCOUNTER — Ambulatory Visit: Payer: Medicare Other | Admitting: Emergency Medicine

## 2017-09-22 NOTE — Progress Notes (Signed)
Here for influenza shot.

## 2017-11-23 ENCOUNTER — Telehealth: Payer: Self-pay | Admitting: Internal Medicine

## 2017-11-23 MED ORDER — ALBUTEROL SULFATE HFA 108 (90 BASE) MCG/ACT IN AERS: 2.0000 | INHALATION_SPRAY | Freq: Four times a day (QID) | RESPIRATORY_TRACT | 11 refills | Status: AC | PRN

## 2017-11-23 NOTE — Telephone Encounter (Signed)
Spoke with pt who states that he needs a rescue inhaler. Saw where pt was on the albuterol HFA (Proventil) inhaler that was started 05/11/17. Will send a new Rx to pt's pharmacy of choice. Nothing further needed.

## 2017-11-27 ENCOUNTER — Encounter: Payer: Self-pay | Admitting: Internal Medicine

## 2017-11-27 ENCOUNTER — Other Ambulatory Visit (INDEPENDENT_AMBULATORY_CARE_PROVIDER_SITE_OTHER): Payer: Medicare Other

## 2017-11-27 ENCOUNTER — Telehealth: Payer: Self-pay | Admitting: Internal Medicine

## 2017-11-27 ENCOUNTER — Ambulatory Visit (INDEPENDENT_AMBULATORY_CARE_PROVIDER_SITE_OTHER): Payer: Medicare Other | Admitting: Internal Medicine

## 2017-11-27 ENCOUNTER — Ambulatory Visit (INDEPENDENT_AMBULATORY_CARE_PROVIDER_SITE_OTHER)
Admission: RE | Admit: 2017-11-27 | Discharge: 2017-11-27 | Disposition: A | Discharge status: Home / Self Care | Payer: Medicare Other | Source: Ambulatory Visit | Attending: Internal Medicine | Admitting: Internal Medicine

## 2017-11-27 VITALS — BP 116/60 | HR 102 | Temp 99.2°F | Ht 70.0 in | Wt 112.0 lb

## 2017-11-27 DIAGNOSIS — J441 Chronic obstructive pulmonary disease with (acute) exacerbation: Secondary | ICD-10-CM

## 2017-11-27 DIAGNOSIS — J449 Chronic obstructive pulmonary disease, unspecified: Secondary | ICD-10-CM

## 2017-11-27 DIAGNOSIS — R0609 Other forms of dyspnea: Secondary | ICD-10-CM

## 2017-11-27 DIAGNOSIS — R9389 Abnormal findings on diagnostic imaging of other specified body structures: Secondary | ICD-10-CM | POA: Diagnosis not present

## 2017-11-27 DIAGNOSIS — F17210 Nicotine dependence, cigarettes, uncomplicated: Secondary | ICD-10-CM

## 2017-11-27 LAB — CBC WITH DIFFERENTIAL/PLATELET
BASOS ABS: 0 10*3/uL (ref 0.0–0.1)
Basophils Relative: 0.7 % (ref 0.0–3.0)
EOS ABS: 0 10*3/uL (ref 0.0–0.7)
Eosinophils Relative: 0.1 % (ref 0.0–5.0)
HCT: 41.4 % (ref 39.0–52.0)
HEMOGLOBIN: 13.6 g/dL (ref 13.0–17.0)
Lymphocytes Relative: 8.1 % — ABNORMAL LOW (ref 12.0–46.0)
Lymphs Abs: 0.5 10*3/uL — ABNORMAL LOW (ref 0.7–4.0)
MCHC: 32.8 g/dL (ref 30.0–36.0)
MCV: 99.9 fl (ref 78.0–100.0)
MONO ABS: 0.8 10*3/uL (ref 0.1–1.0)
Monocytes Relative: 14.2 % — ABNORMAL HIGH (ref 3.0–12.0)
Neutro Abs: 4.3 10*3/uL (ref 1.4–7.7)
Neutrophils Relative %: 76.9 % (ref 43.0–77.0)
Platelets: 292 10*3/uL (ref 150.0–400.0)
RBC: 4.15 Mil/uL — AB (ref 4.22–5.81)
RDW: 17.6 % — ABNORMAL HIGH (ref 11.5–15.5)
WBC: 5.6 10*3/uL (ref 4.0–10.5)

## 2017-11-27 LAB — BRAIN NATRIURETIC PEPTIDE: PRO B NATRI PEPTIDE: 400 pg/mL — AB (ref 0.0–100.0)

## 2017-11-27 LAB — TSH: TSH: 1.34 u[IU]/mL (ref 0.35–4.50)

## 2017-11-27 MED ORDER — AMOXICILLIN-POT CLAVULANATE 875-125 MG PO TABS: 1.0000 | ORAL_TABLET | Freq: Two times a day (BID) | ORAL | 0 refills | Status: AC

## 2017-11-27 MED ORDER — FLUTTER DEVI: 0 refills | Status: AC

## 2017-11-27 MED ORDER — IPRATROPIUM-ALBUTEROL 0.5-2.5 (3) MG/3ML IN SOLN
3.0000 mL | Freq: Four times a day (QID) | RESPIRATORY_TRACT | Status: AC
  Administered 2017-11-27: 3 mL via RESPIRATORY_TRACT

## 2017-11-27 MED ORDER — PREDNISONE 10 MG PO TABS: ORAL_TABLET | ORAL | 0 refills | Status: AC

## 2017-11-27 MED ORDER — ALBUTEROL SULFATE (2.5 MG/3ML) 0.083% IN NEBU: 2.5000 mg | INHALATION_SOLUTION | RESPIRATORY_TRACT | 5 refills | Status: AC | PRN

## 2017-11-27 NOTE — Telephone Encounter (Signed)
Since this is a new problem and I haven't seen in > 3 m needs ov to sort out > me or NP next available

## 2017-11-27 NOTE — Progress Notes (Signed)
Subjective:     Patient ID: Vincent [name], male   DOB: May 19, 1936,    MRN: [id number]    Brief patient profile:  3 yowm quit smoking 04/2017 with dx copd "years back prior to computerized records" and h/o TB in his 80s in [place] rx medically x 9 month and cleared referred to pulmonary clinic 06/19/2017 by Dr   Jenny Reichmann for progressive doe with GOLD III COPD criteria 06/19/2017    History of Present Illness  06/19/2017 1st [facility] office visit/ Vincent [name]   Chief Complaint  Patient presents with  . Pulmonary Consult    Referred by Dr. Cathlean Cower.  Pt c/o cough and SOB over the past few yrs.  He states he gets SOB walking one flight of stairs. He has prod cough with clear sputum.  He has an albuterol inhaler that he rarely uses.   on spiriva  Dpi daily  for several years and symbicort listed but he never started it  [facility] = can't walk a nl pace on a flat grade s sob but does fine slow and flat eg ht ok  rec Plan A = Automatic = stop spriva and start stiolto 2 pffs each am Work on inhaler technique:  relax and gently blow all the way out then take a nice smooth deep breath back in, triggering the inhaler at same time you start breathing in.  Hold for up to 5 seconds if you can.   Plan B = Backup Only use your albuterol as a rescue medication to be used if you can't catch your breath  Please remember to go to the  x-ray department downstairs in the basement  for your tests - we will call you with the results when they are available.  Please schedule a follow up office visit in 6 weeks, call sooner if needed   Late add:  pfts on return     07/31/2017  f/u ov/Vincent [name] re:  Copd GOLD II/III on stiolto  Chief Complaint  Patient presents with  . Follow-up    PFT's done on 05/28/17. Pt states his breathing is unchanged. He has not used his albuterol. No new co's today. He has resumed smoking again since last visit- a few cigs per day.    doe = MMRC2 = can't walk a nl pace on a flat grade s sob but does  fine slow and flat - shops anywhere he wants to go Sleeping fine flat/ RA/ no saba need rec Please remember to go to the lab  department downstairs in the basement  for your tests - we will call you with the results when they are available. Continue stioloto 2 pffs when your first get up     11/27/2017  Acute  ov/Vincent [name] re: sob/ cough  Chief Complaint  Patient presents with  . Acute Visit    Increased SOB since Thanksgiving 2018. He states he is SOB "basically all the time". He is wheezing and coughing with brown sputum. His sats when he arrived in our wheelchair at rest were 87% RA--increased to 93% 2lpm cont o2. He has noticed some swelling in his feet for the past 2 days.   gradually downhill x sev weeks prior to OV  With cough/congestion > purulent mucus and sob with any activity, unable to use saba well due to short Ti, usually can get his breath at rest but sometimes gets sense of panic /sob even at rest and new lew swelling   No obvious day to  day or daytime variability or assoc   mucus plugs or hemoptysis or cp     or overt sinus or hb symptoms. No unusual exposure hx or h/o childhood pna/ asthma or knowledge of premature birth.    Also denies any obvious fluctuation of symptoms with weather or environmental changes or other aggravating or alleviating factors except as outlined above   Current Allergies, Complete Past Medical History, Past Surgical History, Family History, and Social History were reviewed in [employer] record.  ROS  The following are not active complaints unless bolded Hoarseness, sore throat, dysphagia, dental problems, itching, sneezing,  nasal congestion or discharge of excess mucus or purulent secretions, ear ache,   fever, chills, sweats, unintended wt loss or wt gain, classically pleuritic or exertional cp,  orthopnea pnd or leg swelling, presyncope, palpitations, abdominal pain, anorexia, nausea, vomiting, diarrhea  or change in bowel habits  or change in bladder habits, change in stools or change in urine, dysuria, hematuria,  rash, arthralgias, visual complaints, headache, numbness, weakness or ataxia or problems with walking or coordination,  change in mood/affect or memory.        Current Meds  Medication Sig  . albuterol (PROVENTIL HFA) 108 (90 Base) MCG/ACT inhaler Inhale 2 puffs into the lungs every 6 (six) hours as needed for wheezing or shortness of breath.  Marland Kitchen aspirin 81 MG EC tablet TAKE 1 TABLET (81 MG TOTAL) BY MOUTH DAILY.  . B Complex-Folic Acid (B COMPLEX-VITAMIN B12 PO) Take 1 tablet by mouth daily.  . Tiotropium Bromide-Olodaterol (STIOLTO RESPIMAT) 2.5-2.5 MCG/ACT AERS Inhale 2 puffs into the lungs daily.   Current Facility-Administered Medications for the 11/27/17 encounter (Office Visit) with Tanda Rockers, MD  Medication  . ipratropium-albuterol (DUONEB) 0.5-2.5 (3) MG/3ML nebulizer solution 3 mL                  Objective:   Physical Exam  Chronically appearing wm w/c bound mild increased wob at rest   11/27/2017       112   07/31/2017       115  05/11/17 126 lb (57.2 kg)  11/11/16 119 lb (54 kg)  05/11/16 117 lb (53.1 kg)     Vital signs reviewed - Note on arrival 02 sats  87% RA and 95% on 2lpm    HEENT: nl dentition, turbinates bilaterally, and oropharynx. Nl external ear canals without cough reflex   NECK :  without JVD/Nodes/TM/ nl carotid upstrokes bilaterally   LUNGS: Positive for acc muscle use, barrel  contour chest with distant insp and exp rhonchi bilaterally   CV:  RRR  no s3 or murmur or increase in P2, and 1+ pitting  edema sym both ankles  ABD:  soft and nontender with nl inspiratory excursion in the supine position. No bruits or organomegaly appreciated, bowel sounds nl  MS:  Nl gait/ ext warm without deformities, calf tenderness, cyanosis or clubbing No obvious joint restrictions   SKIN: warm and dry without lesions    NEURO:  alert, approp, nl sensorium with  no  motor or cerebellar deficits apparent.       CXR PA and Lateral:   11/27/2017 :    I personally reviewed images and agree with radiology impression as follows:    1. Small right pleural effusion and associated right lower lobe atelectasis. 2. Stable emphysema and pulmonary hyperinflation. 3. Stable biapical scarring.   Labs ordered/ reviewed:      Chemistry  Component Value Date/Time   NA 133 (L) 11/27/2017 1526   K 4.7 11/27/2017 1526   CL 102 11/27/2017 1526   CO2 23 11/27/2017 1526   BUN 16 11/27/2017 1526   CREATININE 0.90 11/27/2017 1526      Component Value Date/Time   CALCIUM 8.6 11/27/2017 1526   ALKPHOS 83 05/01/2017 0915   AST 17 05/01/2017 0915   ALT 12 05/01/2017 0915   BILITOT 0.5 05/01/2017 0915        Lab Results  Component Value Date   WBC 5.6 11/27/2017   HGB 13.6 11/27/2017   HCT 41.4 11/27/2017   MCV 99.9 11/27/2017   PLT 292.0 11/27/2017        Lab Results  Component Value Date   TSH 1.34 11/27/2017     Lab Results  Component Value Date   PROBNP 400.0 (H) 11/27/2017                  Assessment:

## 2017-11-27 NOTE — Telephone Encounter (Signed)
Spoke with pt and advised of Dr Gustavus Bryant recommendations.  Pt given appt today at 2:30 and was advised to bring all medications with him to visit.  PT verbalized understanding.  Nothing further needed.

## 2017-11-27 NOTE — Telephone Encounter (Signed)
Spoke with pt.  Pt reports having panic attack on 11/23/17 that lasted 30 minutes with symptoms of wheezing, hyperventilating and heart palpitations.  PT had another episode 11/26/17 that lasted 15 minutes.  Once attack settles pt has taken Albuterol and this has helped.  PT is requesting advice from Dr Melvyn Novas.  PT has not talked with Dr Jenny Reichmann about this yet.  PT reports that this is a new thing that is going on.  Please advise.

## 2017-11-27 NOTE — Patient Instructions (Addendum)
Prednisone 10 mg take  4 each am x 2 days,   2 each am x 2 days,  1 each am x 2 days and stop   Augmentin 875 mg take one pill twice daily  X 10 days - take at breakfast and supper with large glass of water.  It would help reduce the usual side effects (diarrhea and yeast infections) if you ate cultured yogurt at lunch.   For cough > mucinex dm 1200 mg every 12 hours and use the flutter valve as much as you can  For breathing > Albuterol nebulizer every 4 hours as needed     Please remember to go to the lab and x-ray department downstairs in the basement  for your tests - we will call you with the results when they are available.   Please schedule a follow up office visit in 2 weeks, sooner if needed to see one of our NPs >  Go to ER in meantime if condition worsens  - add: cxr on return

## 2017-11-28 ENCOUNTER — Telehealth: Payer: Self-pay | Admitting: Internal Medicine

## 2017-11-28 ENCOUNTER — Encounter: Payer: Self-pay | Admitting: Internal Medicine

## 2017-11-28 ENCOUNTER — Telehealth: Payer: Self-pay

## 2017-11-28 DIAGNOSIS — J441 Chronic obstructive pulmonary disease with (acute) exacerbation: Secondary | ICD-10-CM | POA: Insufficient documentation

## 2017-11-28 DIAGNOSIS — R9389 Abnormal findings on diagnostic imaging of other specified body structures: Secondary | ICD-10-CM | POA: Insufficient documentation

## 2017-11-28 LAB — BASIC METABOLIC PANEL
BUN: 16 mg/dL (ref 6–23)
CHLORIDE: 102 meq/L (ref 96–112)
CO2: 23 meq/L (ref 19–32)
Calcium: 8.6 mg/dL (ref 8.4–10.5)
Creatinine, Ser: 0.9 mg/dL (ref 0.40–1.50)
GFR: 86.52 mL/min (ref >60.00)
Glucose, Bld: 101 mg/dL — ABNORMAL HIGH (ref 70–99)
Potassium: 4.7 mEq/L (ref 3.5–5.1)
SODIUM: 133 meq/L — AB (ref 135–145)

## 2017-12-11 ENCOUNTER — Ambulatory Visit: Payer: Medicare Other | Admitting: Internal Medicine

## 2017-12-26 NOTE — Telephone Encounter (Signed)
On 12-02-17 I received a d/c from [employer] ([place]). The d/c is for burial. The patient is a patient of Human resources officer. The d/c will be taken to Pulmonary Unit @ [facility] this pm for signature.  On 11/29/17 I received the d/c back from [place].  I got the d/c ready and called the funeral home to let them know the d/c is ready for pickup.

## 2017-12-26 NOTE — Assessment & Plan Note (Signed)
>   3 min  Matter of life or breath reviewed, warned if needs 02 or admit will not be allowed to smoke and if stops completely may be able to avoid either one (02 or admit)

## 2017-12-26 NOTE — Assessment & Plan Note (Signed)
Onset of symptoms tgiving 2018 >  sats high 80s at ov 11/27/2017 > rx home neb/ augmentin / pred   DDX of  difficult airways management almost all start with A and  include Adherence, Ace Inhibitors, Acid Reflux, Active Sinus Disease, Alpha 1 Antitripsin deficiency, Anxiety masquerading as Airways dz,  ABPA,  Allergy(esp in young), Aspiration (esp in elderly), Adverse effects of meds,  Active smokers, A bunch of PE's (a small clot burden can't cause this syndrome unless there is already severe underlying pulm or vascular dz with poor reserve) plus two Bs  = Bronchiectasis and Beta blocker use..and one C= CHF   Adherence is always the initial "prime suspect" and is a multilayered concern that requires a "trust but verify" approach in every patient - starting with knowing how to use medications, especially inhalers, correctly, keeping up with refills and understanding the fundamental difference between maintenance and prns vs those medications only taken for a very short course and then stopped and not refilled.  - see hfa teaching > air trapping is causing low IC and Ti too short so given neb in office with some relief and try saba up to q 4h and if still not comfortable > admit   Active smoking  (see separate a/p)   ? Active sinus dz > augmentin  10 days  ? Allergy /asthma > Prednisone 10 mg take  4 each am x 2 days,   2 each am x 2 days,  1 each am x 2 days and stop    ? chf / ? Cor pulmonale > suggested by R effusion and leg swelling and justifies short term 02 if not rapidly improving >  Check bnp / echo if indicated    I had an extended discussion with the patient reviewing all relevant studies completed to date and  lasting 25 minutes of a 40  minute acute office  visit addressing   severe non-specific but potentially very serious refractory respiratory symptoms of uncertain and potentially multiple  etiologies.   Each maintenance medication was reviewed in detail including most importantly  the difference between maintenance and prns and under what circumstances the prns are to be triggered using an action plan format that is not reflected in the computer generated alphabetically organized AVS.    Please see AVS for specific instructions unique to this office visit that I personally wrote and verbalized to the the pt in detail and then reviewed with pt  by my nurse highlighting any changes in therapy/plan of care  recommended at today's visit.

## 2017-12-26 NOTE — Assessment & Plan Note (Signed)
Spirometry 06/19/2017  FEV1 1.42 (48%)  Ratio 44  p am spiriva - 06/19/2017  After extensive coaching device  effectiveness =    75% smi > try stiolto  - PFT's  07/31/2017  FEV1 1.54 (52 % ) ratio 49  p no % improvement from saba p stiolto then night  prior to study with DLCO  18 % corrects to 19  % for alv volume  - Alpha one AT screening 07/31/2017   MM -  level 173  - 11/27/2017  After extensive coaching HFA effectiveness =    75% (short Ti)  - 11/27/2017  Home neb provided    See aecopd

## 2017-12-26 NOTE — Assessment & Plan Note (Signed)
Assoc with pedal edema suggests component chf /cor pulmonale > check bnp today ? Echo on return in 2 weeks

## 2017-12-26 NOTE — Telephone Encounter (Signed)
Patient's daughter called inform that her father passed away this morning.   I believe EMS called this morning to speak with Dr. Jenny Reichmann.

## 2017-12-26 NOTE — Telephone Encounter (Signed)
Noted, I will do death certificate

## 2017-12-26 DEATH — deceased

## 2018-01-31 ENCOUNTER — Ambulatory Visit: Payer: Medicare Other | Admitting: Internal Medicine

## 2018-05-15 ENCOUNTER — Encounter: Payer: Medicare Other | Admitting: Internal Medicine

## 2019-02-25 IMAGING — DX DG CHEST 2V
2 series · 2 of 2 positions shown · non-contrast
Comparison: Prior chest x-ray 06/19/2017

CLINICAL DATA: 80-year-old male with cough, congestion and
progressive shortness of breath

EXAM:
CHEST  2 VIEW

[chest pa]
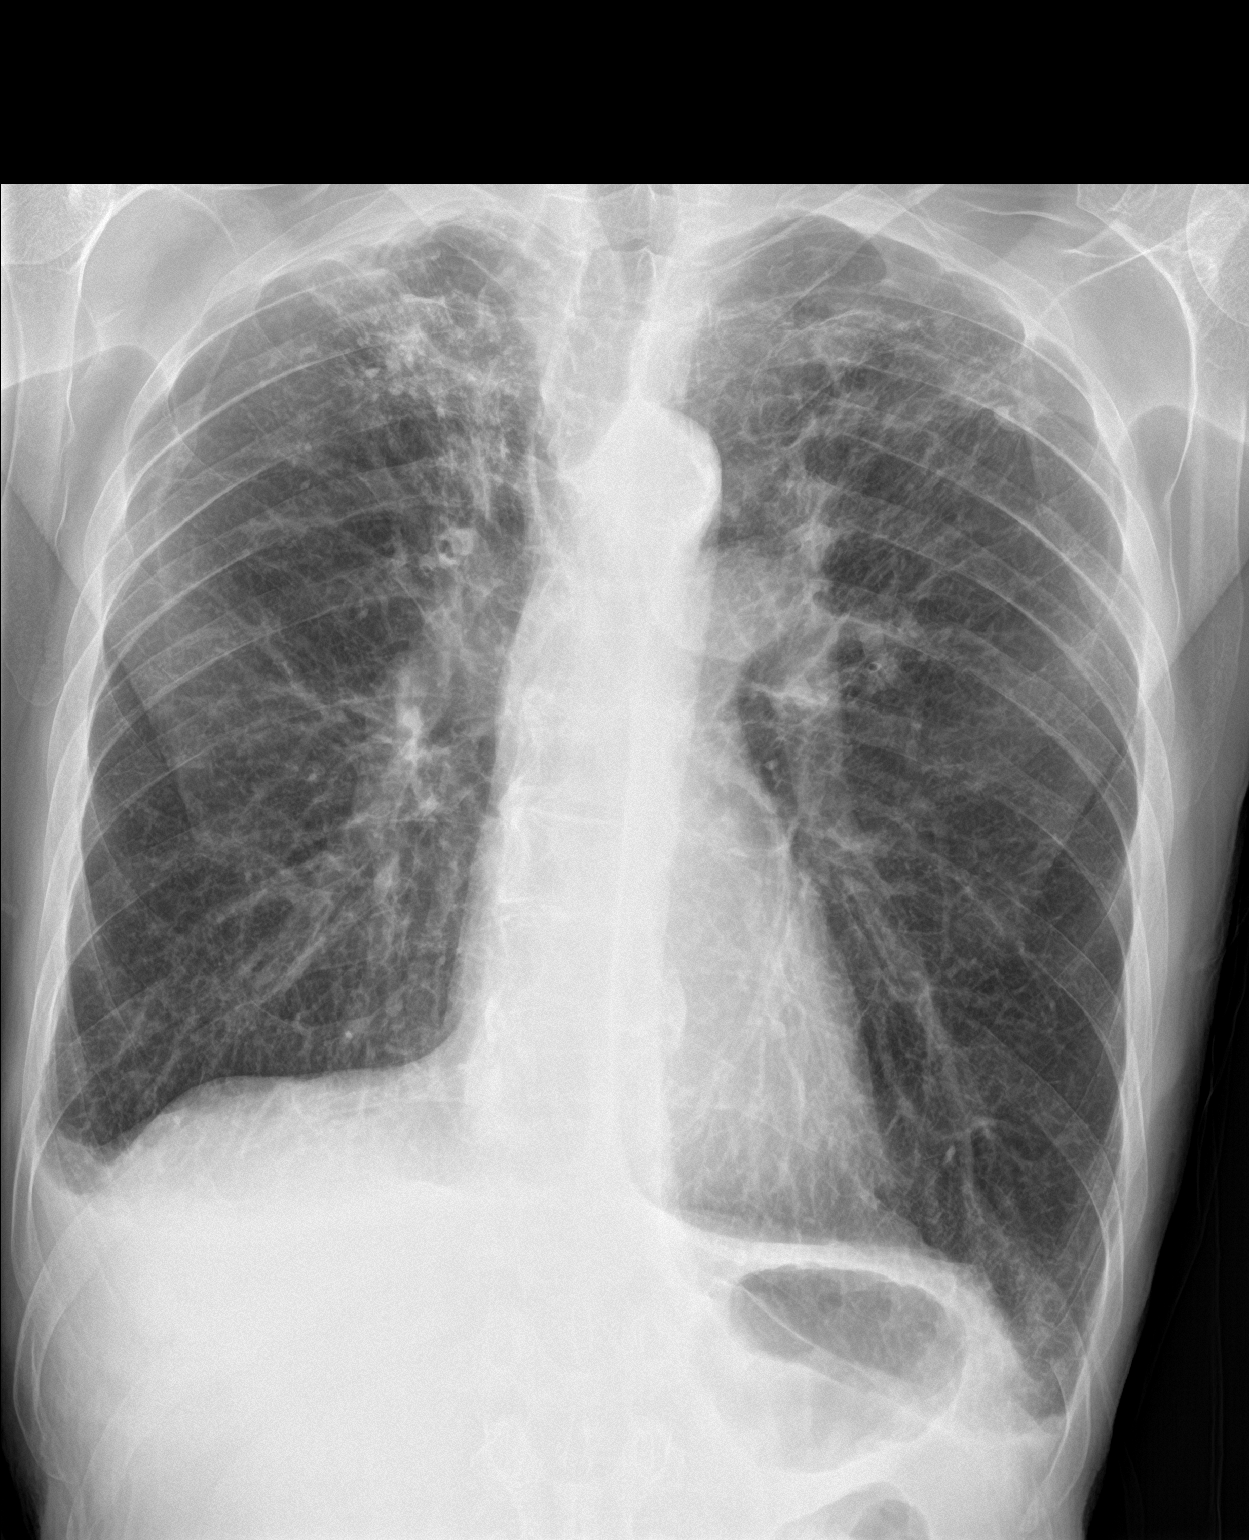

[chest lat]
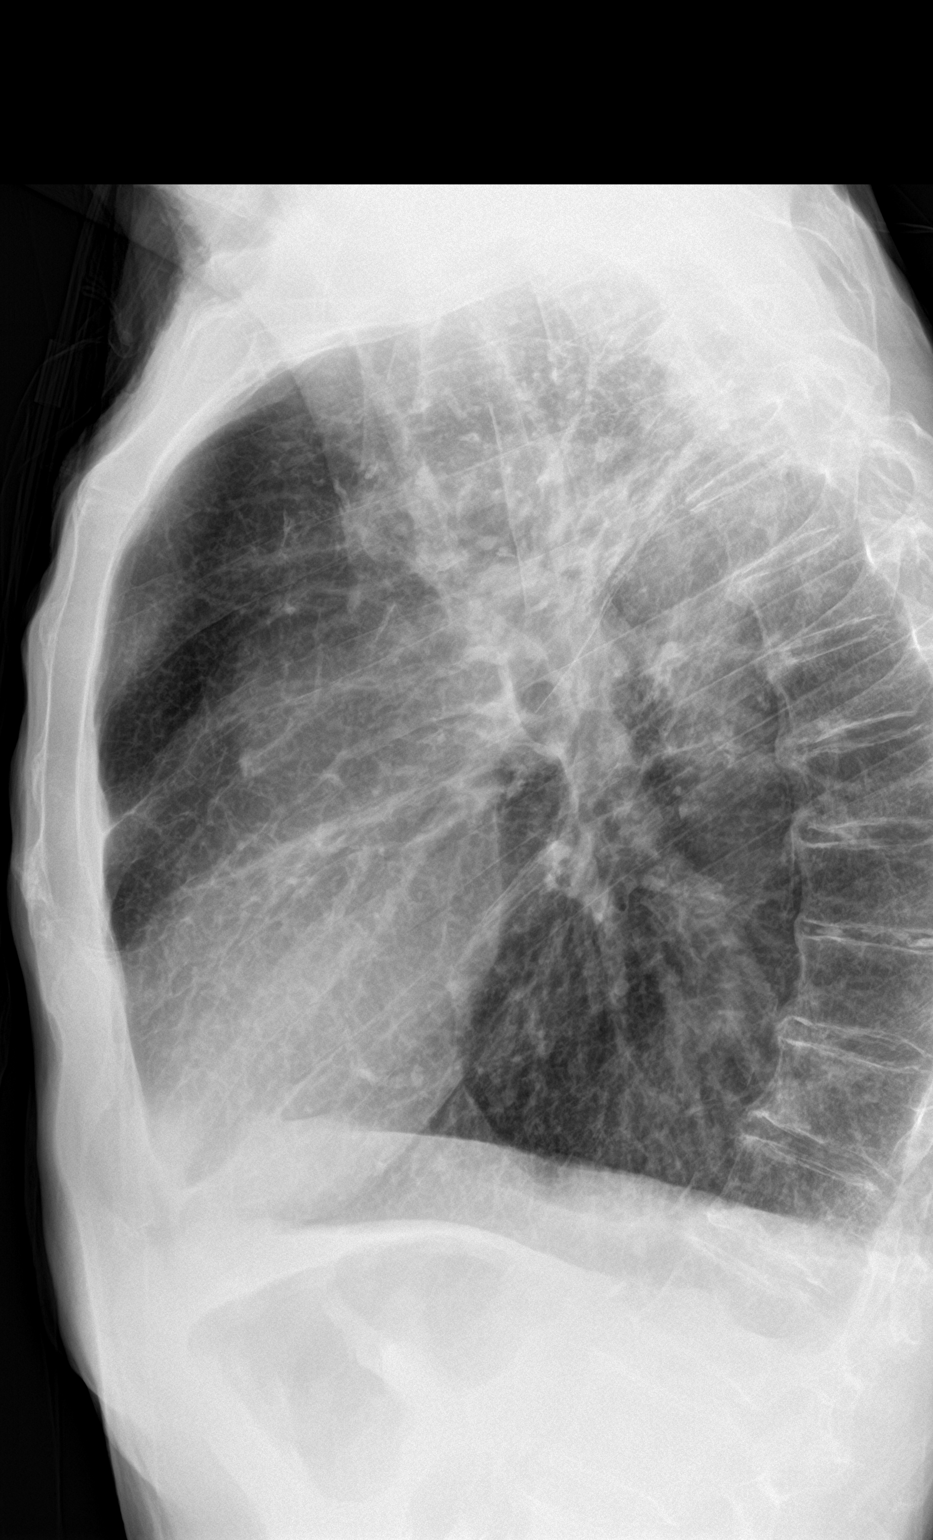

[2 of 2 positions shown; findings below may reference images not displayed]

FINDINGS: Stable appearance of biapical pleuroparenchymal scarring. Overall
pulmonary- hyperinflation and mild interstitial prominence remains
unchanged in consistent with underlying emphysema. Small right-sided
layering pleural effusion. No focal airspace consolidation. No
pneumothorax. Cardiac and mediastinal contours remain within normal
limits. Atherosclerotic calcifications are present in the transverse
aorta. Osseous structures are intact and unremarkable for age.
IMPRESSION: 1. Small right pleural effusion and associated right lower lobe
atelectasis.
2. Stable emphysema and pulmonary hyperinflation.
3. Stable biapical scarring.
